# Patient Record
Sex: Female | Born: 2000 | Race: Black or African American | Hispanic: No | Marital: Single | Smoking: Never smoker
Health system: Southern US, Academic
[De-identification: ages and names within clinical notes are randomized; demographics above are authoritative.]

## PROBLEM LIST (undated history)

## (undated) DIAGNOSIS — K2100 Gastro-esophageal reflux disease with esophagitis, without bleeding: Secondary | ICD-10-CM

## (undated) DIAGNOSIS — J45909 Unspecified asthma, uncomplicated: Secondary | ICD-10-CM

## (undated) HISTORY — PX: CHOLECYSTECTOMY: SHX55

---
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
## 2018-04-01 ENCOUNTER — Emergency Department (HOSPITAL_COMMUNITY): Payer: Medicaid - Out of State

## 2018-04-01 ENCOUNTER — Other Ambulatory Visit: Payer: Self-pay

## 2018-04-01 ENCOUNTER — Encounter (HOSPITAL_COMMUNITY): Payer: Self-pay

## 2018-04-01 ENCOUNTER — Emergency Department (HOSPITAL_COMMUNITY)
Admission: EM | Admit: 2018-04-01 | Discharge: 2018-04-01 | Disposition: A | Discharge status: Home / Self Care | Payer: Medicaid - Out of State | Attending: Emergency Medicine | Admitting: Emergency Medicine

## 2018-04-01 DIAGNOSIS — J45909 Unspecified asthma, uncomplicated: Secondary | ICD-10-CM | POA: Diagnosis not present

## 2018-04-01 DIAGNOSIS — R000 Tachycardia, unspecified: Secondary | ICD-10-CM | POA: Diagnosis present

## 2018-04-01 LAB — I-STAT BETA HCG BLOOD, ED (MC, WL, AP ONLY)

## 2018-04-01 LAB — CBC
HCT: 37.1 % (ref 36.0–46.0)
Hemoglobin: 12.3 g/dL (ref 12.0–15.0)
MCH: 29.4 pg (ref 26.0–34.0)
MCHC: 33.2 g/dL (ref 30.0–36.0)
MCV: 88.8 fL (ref 80.0–100.0)
NRBC: 0.2 % (ref 0.0–0.2)
PLATELETS: 366 10*3/uL (ref 150–400)
RBC: 4.18 MIL/uL (ref 3.87–5.11)
RDW: 11.8 % (ref 11.5–15.5)
WBC: 11.5 10*3/uL — AB (ref 4.0–10.5)

## 2018-04-01 LAB — BASIC METABOLIC PANEL
Anion gap: 8 (ref 5–15)
BUN: 7 mg/dL (ref 6–20)
CALCIUM: 9.2 mg/dL (ref 8.9–10.3)
CO2: 24 mmol/L (ref 22–32)
Chloride: 105 mmol/L (ref 98–111)
Creatinine, Ser: 0.8 mg/dL (ref 0.44–1.00)
GFR calc non Af Amer: >60 mL/min (ref >60)
Glucose, Bld: 102 mg/dL — ABNORMAL HIGH (ref 70–99)
Potassium: 3.2 mmol/L — ABNORMAL LOW (ref 3.5–5.1)
SODIUM: 137 mmol/L (ref 135–145)

## 2018-04-01 LAB — TROPONIN I

## 2018-04-01 LAB — D-DIMER, QUANTITATIVE: D-Dimer, Quant: <0.27 ug/mL-FEU (ref 0.00–0.50)

## 2018-04-01 LAB — I-STAT TROPONIN, ED: Troponin i, poc: 0 ng/mL (ref 0.00–0.08)

## 2018-04-01 MED ORDER — SODIUM CHLORIDE 0.9 % IV BOLUS
1000.0000 mL | Freq: Once | INTRAVENOUS | Status: AC
  Administered 2018-04-01: 1000 mL via INTRAVENOUS

## 2018-04-01 NOTE — ED Provider Notes (Signed)
[facility] EMERGENCY DEPARTMENT Provider Note   CSN: [id number] Arrival date & time: 04/01/18  0113     History   Chief Complaint Chief Complaint  Patient presents with  . Tachycardia    HPI Michelle Byrd is a 17 y.o. female presenting today for palpitations and tachycardia after smoking marijuana with her friends at midnight.  Patient states that about 10 minutes after smoking she developed palpitations that she describes as a fluttering in the center of her chest.  Patient then went outside to get fresh air and the palpitations did not resolve so she presented to the emergency department.  The patient states that the palpitations resolved spontaneously shortly after arrival to the emergency department.  Patient states that she occasionally gets palpitations that she generally attributes to anxiety.  At time of evaluation tachycardia has resolved, patient resting comfortably in bed in no acute distress.  Patient states that she is felt well and has not had palpitations for the past few hours.  Patient denies family history of MI under the age of 74, history of DVT, family history of sudden cardiac death, personal history of cancer.  Patient states that she does take oral birth control pills.  HPI  Past Medical History:  Diagnosis Date  . Asthma   . Reflux esophagitis     There are no active problems to display for this patient.   History reviewed. No pertinent surgical history.   OB History   None      Home Medications    Prior to Admission medications   Medication Sig Start Date End Date Taking? Authorizing Provider  PROAIR HFA 108 [phone] Base) MCG/ACT inhaler Take 2 puffs by mouth every 4 (four) hours as needed for wheezing or shortness of breath.  05/20/17  Yes [provider]  omeprazole (PRILOSEC) 20 MG capsule Take 1 capsule (20 mg total) by mouth daily. Patient not taking: Reported on 08/19/2017 01/23/17   Muthersbaugh, Dahlia Client, PA-C    ondansetron (ZOFRAN ODT) 8 MG disintegrating tablet 8mg  ODT q4 hours prn nausea Patient not taking: Reported on 04/01/2018 01/23/17   Muthersbaugh, Dahlia Client, PA-C  ranitidine (ZANTAC) 150 MG tablet Take 1 tablet (150 mg total) by mouth 2 (two) times daily. Patient not taking: Reported on 04/01/2018 01/23/17   Muthersbaugh, Boyd Kerbs    Family History History reviewed. No pertinent family history.  Social History Social History   Tobacco Use  . Smoking status: Never Smoker  . Smokeless tobacco: Never Used  Substance Use Topics  . Alcohol use: No  . Drug use: No     Allergies   Morphine and related   Review of Systems Review of Systems  Constitutional: Negative.  Negative for chills and fever.  HENT: Negative.  Negative for rhinorrhea and sore throat.   Eyes: Negative.  Negative for visual disturbance.  Respiratory: Negative.  Negative for cough and shortness of breath.   Cardiovascular: Positive for palpitations. Negative for chest pain and leg swelling.  Gastrointestinal: Negative.  Negative for abdominal pain, blood in stool, diarrhea, nausea and vomiting.  Genitourinary: Negative.  Negative for dysuria and hematuria.  Musculoskeletal: Negative.  Negative for arthralgias and myalgias.  Skin: Negative.  Negative for rash.  Neurological: Negative.  Negative for dizziness, weakness and headaches.     Physical Exam Updated Vital Signs BP (!) 118/57   Pulse 86   Temp 98.1 F (36.7 C) (Oral)   Resp 18   Ht 5\' 3"  (1.6 m)  Wt 61.2 kg   SpO2 100%   BMI 23.91 kg/m   Physical Exam  Constitutional: She appears well-developed and well-nourished. No distress.  HENT:  Head: Normocephalic and atraumatic.  Right Ear: External ear normal.  Left Ear: External ear normal.  Nose: Nose normal.  Mouth/Throat: Uvula is midline and oropharynx is clear and moist.  Eyes: Pupils are equal, round, and reactive to light. EOM are normal.  Neck: Trachea normal, normal range of  motion, full passive range of motion without pain and phonation normal. Neck supple. No tracheal deviation present.  Cardiovascular: Normal rate, regular rhythm, normal heart sounds and intact distal pulses.  Pulses:      Radial pulses are 2+ on the right side, and 2+ on the left side.       Dorsalis pedis pulses are 2+ on the right side, and 2+ on the left side.       Posterior tibial pulses are 2+ on the right side, and 2+ on the left side.  Pulmonary/Chest: Effort normal and breath sounds normal. No respiratory distress. She exhibits no tenderness, no crepitus and no deformity.  Abdominal: Soft. There is no tenderness. There is no rigidity, no rebound and no guarding.  Musculoskeletal: Normal range of motion.       Right lower leg: Normal.       Left lower leg: Normal.  Feet:  Right Foot:  Protective Sensation: 3 sites tested. 3 sites sensed.  Left Foot:  Protective Sensation: 3 sites tested. 3 sites sensed.  Neurological: She is alert. No sensory deficit. GCS eye subscore is 4. GCS verbal subscore is 5. GCS motor subscore is 6.  Speech is clear and goal oriented, follows commands Major Cranial nerves without deficit, no facial droop Normal strength in upper and lower extremities bilaterally including dorsiflexion and plantar flexion, strong and equal grip strength Sensation normal to light touch Moves extremities without ataxia, coordination intact Normal gait  Skin: Skin is warm and dry. Capillary refill takes less than 2 seconds.  Psychiatric: She has a normal mood and affect. Her behavior is normal.   ED Treatments / Results  Labs (all labs ordered are listed, but only abnormal results are displayed) Labs Reviewed  BASIC METABOLIC PANEL - Abnormal; Notable for the following components:      Result Value   Potassium 3.2 (*)    Glucose, Bld 102 (*)    All other components within normal limits  CBC - Abnormal; Notable for the following components:   WBC 11.5 (*)    All other  components within normal limits  D-DIMER, QUANTITATIVE (NOT AT [facility])  TROPONIN I  I-STAT TROPONIN, ED  I-STAT BETA HCG BLOOD, ED ([facility], [facility], AP ONLY)    EKG EKG Interpretation  Date/Time:  Tuesday April 01 2018 07:47:01 EDT Ventricular Rate:  79 PR Interval:  176 QRS Duration: 67 QT Interval:  356 QTC Calculation: 408 R Axis:   86 Text Interpretation:  Sinus rhythm Nonspecific T abnrm, anterolateral leads ST elev, probable normal early repol pattern Confirmed by Tilden Fossa [phone]) on 04/01/2018 8:29:36 AM Also confirmed by Tilden Fossa [phone]), editor Jac Canavan, [place] ([id number])  on 04/01/2018 9:38:07 AM  Vent. rate 79 BPM PR interval * ms QRS duration 67 ms QT/QTc 356/408 ms P-R-T axes 104 86 69 Sinus rhythm Nonspecific T abnrm, anterolateral leads ST elev, probable normal early repol pattern Confirmed by Tilden Fossa [phone]) on 04/01/2018 8:29:36 AM Also confirmed by Tilden Fossa [phone]), editor Elita Quick ([id number]) on  04/01/2018 9:38:07 AM   Radiology Dg Chest 2 View  Result Date: 04/01/2018 CLINICAL DATA:  Tachycardia, chest tightness EXAM: CHEST - 2 VIEW COMPARISON:  08/19/2017 FINDINGS: Heart and mediastinal contours are within normal limits. No focal opacities or effusions. No acute bony abnormality. IMPRESSION: No active cardiopulmonary disease. Electronically Signed   By: Charlett Nose M.D.   On: 04/01/2018 01:58    Procedures Procedures (including critical care time)  Medications Ordered in ED Medications  sodium chloride 0.9 % bolus 1,000 mL (0 mLs Intravenous Stopped 04/01/18 0928)     Initial Impression / Assessment and Plan / ED Course  I have reviewed the triage vital signs and the nursing notes.  Pertinent labs & imaging results that were available during my care of the patient were reviewed by me and considered in my medical decision making (see chart for details).  Clinical Course as of Apr 01 1126  Tue Apr 01, 2018  4098  Discussed with w/ Dr. Madilyn Hook; pending normal Ddimer and Trop, d/c with cardiology follow-up and encourage cessation of marijuana.    [BM]  1014 Rediscussed with Dr Madilyn Hook; agrees with discharge w/ cardiology follow-up outpatient.   [BM]    Clinical Course User Index [BM] Bill Salinas, PA-C   17 year old female presenting with palpitations and tachycardia after smoking marijuana.  Troponin negative x2 D-dimer negative Beta hCG negative CBC nonacute BMP nonacute Chest x-ray negative Initial EKG with sinus tachycardia-reviewed by Dr. Elesa Massed Subsequent EKG normal sinus-reviewed by Dr. Madilyn Hook  Afebrile, not tachycardic, not hypotensive resting comfortably in no acute distress.  Patient denies recurrence of palpitations since she presented to the emergency department.  Patient has been resting comfortably for approximately 8 hours.  Case discussed with Dr. Madilyn Hook, Dr. Madilyn Hook advises cessation of marijuana and outpatient follow-up with cardiology.  Patient encouraged to stop smoking marijuana and to call cardiology office to schedule appointment today.  At this time there does not appear to be any evidence of an acute emergency medical condition and the patient appears stable for discharge with appropriate outpatient follow up. Diagnosis was discussed with patient who verbalizes understanding of care plan and is agreeable to discharge. I have discussed return precautions with patient whos verbalize understanding of return precautions. Patient strongly encouraged to follow-up with their PCP. All questions answered.   Note: Portions of this report may have been transcribed using voice recognition software. Every effort was made to ensure accuracy; however, inadvertent computerized transcription errors may still be present. Final Clinical Impressions(s) / ED Diagnoses   Final diagnoses:  Sinus tachycardia    ED Discharge Orders    None       Bill Salinas, PA-C 04/01/18 1131    Ward,  Layla Maw, DO 04/02/18 [phone]

## 2018-04-01 NOTE — ED Triage Notes (Signed)
Pt here after smoking mariajuana and her heart rate went up.  Denies any chest pain or shortness of breath. A&Ox4.

## 2018-04-01 NOTE — Discharge Instructions (Signed)
Please return to the Emergency Department for any new or worsening symptoms or if your symptoms do not improve. Please be sure to follow up with your Primary Care Physician as soon as possible regarding your visit today. If you do not have a Primary Doctor please use the resources below to establish one. Please call to schedule appointment with [facility] health cardiology group as soon as possible. Please avoid drug and alcohol use including marijuana.  Contact a health care provider if: You have a fever. You have vomiting or diarrhea that keeps happening (is persistent). Get help right away if: You have pain in your chest, upper arms, jaw, or neck. You become weak or dizzy. You feel faint. You have palpitations that do not go away.  Do not take your medicine if  develop an itchy rash, swelling in your mouth or lips, or difficulty breathing.   RESOURCE GUIDE  Chronic Pain Problems: Contact [facility]  [phone] Patients need to be referred by their primary care doctor.  Insufficient Money for Medicine: Contact United Way:  call "211" or [facility] 803-104-3856.  No Primary Care Doctor: Call Health Connect  [phone] - can help you locate a primary care doctor that  accepts your insurance, provides certain services, etc. Physician Referral Service- [phone]  Agencies that provide inexpensive medical care: [facility]  [phone] [facility]  [phone] [facility]  [phone] [facility]  [phone] [facility]  [phone] [facility] Clinic  [phone]  Medicaid-accepting [facility] Providers: [facility]- [address]  [phone], Mon-Fri 9am-7pm, Sat 9am-1pm [facility]- [address] [place], [address] [place]  [phone] [facility]- [address], [address] [place]  [phone] [facility]- [address]  [phone] Renaye Rakers- [address] [place], [address], [phone]  Only accepts [place] patients after they have their name  applied to their card  Self Pay (no insurance) in [facility]: Sickle Cell Patients: Dr Willey Blade, [facility]  [address] [place], [phone] [facility] Urgent Care- [address] [place]  [phone]       [facility] [place]- [address]       -     [facility]- see information above (Speak to [employer] if you do not have insurance)       -  [facility]- [address] [place], [phone]       -  [facility]- [address],  [phone]       -  [facility]- [address] [phone]       -  Dr Julio Sicks-  [address], [place], [phone]       -  [facility]- [address], [phone]       -  [facility]- [address] [phone], also [address] [phone]       -    [facility]- [address] [place], [phone], 1st & 3rd Saturday   every month, 10am-1pm  1) Find a Doctor and Pay Out of Pocket Although you won't have to find out who is covered by your insurance plan, it is a good idea to ask around and get recommendations. You will then  need to call the office and see if the doctor you have chosen will accept you as a new patient and what types of options they offer for patients who are self-pay. Some doctors offer discounts or will set up payment plans for their patients who do not have insurance, but you will need to ask so you aren't surprised when you get to your appointment.  2) Contact Your Local Health Department Not all health departments have doctors that can see patients for sick visits, but many do, so it is worth a call to see if yours does. If you don't know where your local health department is, you can check in your phone book. The CDC also has a tool to help you locate your state's health  department, and many state websites also have listings of all of their local health departments.  3) Find a Walk-in Clinic If your illness is not likely to be very severe or complicated, you may want to try a walk in clinic. These are popping up all over the country in pharmacies, drugstores, and shopping centers. They're usually staffed by nurse practitioners or physician assistants that have been trained to treat common illnesses and complaints. They're usually fairly quick and inexpensive. However, if you have serious medical issues or chronic medical problems, these are probably not your best option  STD Testing [facility] Department of [facility], STD Clinic, [address], [place], phone [phone] or [phone].  Monday - Friday, call for an appointment. [facility] Department of [employer], STD Clinic, [place] [address], [place], phone [phone] or [phone].  Monday - Friday, call for an appointment.  Abuse/Neglect: [facility] Child Abuse Hotline 715 663 3860 [facility] Child Abuse Hotline 519 140 7427 (After Hours)  Emergency Shelter:  [facility] 8602652321  Maternity Homes: Room at the [place] [facility] [phone] [facility] [phone]  MRSA Hotline #:   219-146-7889  [facility] Resources  [facility] [place]  [employer] [facility] Dept. [address]                 [address]         [address] [place] [address]  Blondell Reveal Phone:  [phone]                                  Phone:  [phone]                   Phone:  [phone]  [facility], [phone] [facility]- [phone]       -     [facility] in [place], [address],                                   [phone], Insurance  [facility] Child Abuse Hotline (640)245-2481 or [phone] (After Hours)   Behavioral Health Services  Substance Abuse Resources: [facility] Drug [facility]  ([phone] [facility] 949-604-6727 [facility] [phone] Floydene Flock 815-319-4541 Residential & Outpatient Substance Abuse Program  [phone]  Psychological Services: [facility]  [phone] [facility] Services  [phone] [facility], [phone] [place]. [address], [place], ACCESS LINE: [phone] or [phone], [employer]za  Dental Assistance  If unable to pay or uninsured, contact:  [facility] or [facility]. to become qualified for the adult dental clinic.  Patients with Medicaid: [facility] [phone] [address], [phone] [address], [phone]  If unable to pay, or uninsured, contact [facility] (806)828-6320) or [facility] Department [phone] in [place], [phone] in [facility]) to become qualified for the adult dental clinic   Other Low-Cost Community Dental Services: [facility]- [address] [place], [place], [place], [address], [phone], [address]. [address], 2nd and 4th Thursday of the month at 6:30am.  10 clients each day by appointment, can sometimes see walk-in patients if someone does not show for an appointment. [facility]- [address] [place], [place], [address], [phone] [facility] [address] [place], [place], [address], [phone] [facility] Health Department- [phone] [facility] Health Department- [phone] [facility] [phone]

## 2018-10-08 ENCOUNTER — Inpatient Hospital Stay (HOSPITAL_COMMUNITY)
Admission: EM | Admit: 2018-10-08 | Discharge: 2018-10-08 | Disposition: A | Discharge status: Home / Self Care | Payer: Medicare (Managed Care) | Source: Other Acute Inpatient Hospital

## 2018-10-08 DIAGNOSIS — R8281 Pyuria: Secondary | ICD-10-CM

## 2018-10-08 DIAGNOSIS — R112 Nausea with vomiting, unspecified: Secondary | ICD-10-CM

## 2018-10-08 DIAGNOSIS — R42 Dizziness and giddiness: Secondary | ICD-10-CM

## 2019-01-03 ENCOUNTER — Inpatient Hospital Stay (HOSPITAL_COMMUNITY)
Admission: EM | Admit: 2019-01-03 | Discharge: 2019-01-03 | Disposition: A | Discharge status: Home / Self Care | Payer: Medicare (Managed Care) | Source: Other Acute Inpatient Hospital

## 2019-01-03 DIAGNOSIS — R05 Cough: Secondary | ICD-10-CM

## 2019-01-03 DIAGNOSIS — K219 Gastro-esophageal reflux disease without esophagitis: Secondary | ICD-10-CM

## 2019-01-03 DIAGNOSIS — J40 Bronchitis, not specified as acute or chronic: Secondary | ICD-10-CM

## 2019-01-10 ENCOUNTER — Inpatient Hospital Stay (HOSPITAL_COMMUNITY)
Admission: EM | Admit: 2019-01-10 | Discharge: 2019-01-10 | Disposition: A | Discharge status: Home / Self Care | Payer: Medicare (Managed Care) | Source: Other Acute Inpatient Hospital

## 2019-01-10 DIAGNOSIS — N760 Acute vaginitis: Secondary | ICD-10-CM

## 2019-02-07 ENCOUNTER — Other Ambulatory Visit: Payer: Self-pay

## 2019-02-07 DIAGNOSIS — Z20822 Contact with and (suspected) exposure to covid-19: Secondary | ICD-10-CM

## 2019-02-09 LAB — NOVEL CORONAVIRUS, NAA: SARS-CoV-2, NAA: NOT DETECTED

## 2019-03-15 ENCOUNTER — Other Ambulatory Visit: Payer: Self-pay

## 2019-03-15 ENCOUNTER — Encounter (HOSPITAL_COMMUNITY): Payer: Self-pay | Admitting: *Deleted

## 2019-03-15 ENCOUNTER — Emergency Department (HOSPITAL_COMMUNITY): Payer: MEDICARE

## 2019-03-15 ENCOUNTER — Emergency Department (HOSPITAL_COMMUNITY)
Admission: EM | Admit: 2019-03-15 | Discharge: 2019-03-15 | Disposition: A | Discharge status: Home / Self Care | Payer: MEDICARE | Attending: Emergency Medicine | Admitting: Emergency Medicine

## 2019-03-15 DIAGNOSIS — R109 Unspecified abdominal pain: Secondary | ICD-10-CM

## 2019-03-15 DIAGNOSIS — R1010 Upper abdominal pain, unspecified: Secondary | ICD-10-CM | POA: Insufficient documentation

## 2019-03-15 DIAGNOSIS — R1110 Vomiting, unspecified: Secondary | ICD-10-CM | POA: Diagnosis not present

## 2019-03-15 DIAGNOSIS — Z79899 Other long term (current) drug therapy: Secondary | ICD-10-CM | POA: Insufficient documentation

## 2019-03-15 DIAGNOSIS — R197 Diarrhea, unspecified: Secondary | ICD-10-CM | POA: Diagnosis not present

## 2019-03-15 DIAGNOSIS — R17 Unspecified jaundice: Secondary | ICD-10-CM | POA: Diagnosis not present

## 2019-03-15 LAB — COMPREHENSIVE METABOLIC PANEL
ALT: 12 U/L (ref 0–44)
AST: 15 U/L (ref 15–41)
Albumin: 3.5 g/dL (ref 3.5–5.0)
Alkaline Phosphatase: 30 U/L — ABNORMAL LOW (ref 38–126)
Anion gap: 10 (ref 5–15)
BUN: 8 mg/dL (ref 6–20)
CO2: 23 mmol/L (ref 22–32)
Calcium: 9 mg/dL (ref 8.9–10.3)
Chloride: 104 mmol/L (ref 98–111)
Creatinine, Ser: 0.77 mg/dL (ref 0.44–1.00)
GFR calc Af Amer: >60 mL/min (ref >60)
GFR calc non Af Amer: >60 mL/min (ref >60)
Glucose, Bld: 112 mg/dL — ABNORMAL HIGH (ref 70–99)
Potassium: 3.7 mmol/L (ref 3.5–5.1)
Sodium: 137 mmol/L (ref 135–145)
Total Bilirubin: 2.6 mg/dL — ABNORMAL HIGH (ref 0.3–1.2)
Total Protein: 6.6 g/dL (ref 6.5–8.1)

## 2019-03-15 LAB — URINALYSIS, ROUTINE W REFLEX MICROSCOPIC
Bilirubin Urine: NEGATIVE
Glucose, UA: NEGATIVE mg/dL
Hgb urine dipstick: NEGATIVE
Ketones, ur: 5 mg/dL — AB
Leukocytes,Ua: NEGATIVE
Nitrite: NEGATIVE
Protein, ur: NEGATIVE mg/dL
Specific Gravity, Urine: 1.018 (ref 1.005–1.030)
pH: 8 (ref 5.0–8.0)

## 2019-03-15 LAB — I-STAT BETA HCG BLOOD, ED (MC, WL, AP ONLY): I-stat hCG, quantitative: <5 m[IU]/mL (ref <5)

## 2019-03-15 LAB — CBC
HCT: 38 % (ref 36.0–46.0)
Hemoglobin: 13.1 g/dL (ref 12.0–15.0)
MCH: 30.8 pg (ref 26.0–34.0)
MCHC: 34.5 g/dL (ref 30.0–36.0)
MCV: 89.2 fL (ref 80.0–100.0)
Platelets: 369 10*3/uL (ref 150–400)
RBC: 4.26 MIL/uL (ref 3.87–5.11)
RDW: 11.9 % (ref 11.5–15.5)
WBC: 7.6 10*3/uL (ref 4.0–10.5)
nRBC: 0 % (ref 0.0–0.2)

## 2019-03-15 LAB — LIPASE, BLOOD: Lipase: 23 U/L (ref 11–51)

## 2019-03-15 MED ORDER — ONDANSETRON 4 MG PO TBDP: ORAL_TABLET | ORAL | 0 refills | Status: DC

## 2019-03-15 MED ORDER — SODIUM CHLORIDE 0.9 % IV BOLUS
1000.0000 mL | Freq: Once | INTRAVENOUS | Status: AC
  Administered 2019-03-15: 1000 mL via INTRAVENOUS

## 2019-03-15 MED ORDER — SODIUM CHLORIDE 0.9% FLUSH: 3.0000 mL | Freq: Once | INTRAVENOUS | Status: DC

## 2019-03-15 MED ORDER — ONDANSETRON HCL 4 MG/2ML IJ SOLN
4.0000 mg | Freq: Once | INTRAMUSCULAR | Status: AC
  Administered 2019-03-15: 4 mg via INTRAVENOUS
  Filled 2019-03-15: qty 2

## 2019-03-15 NOTE — ED Triage Notes (Signed)
Vomiting, diarrhea and abdominal pain since yesterday, feels dehydrated. No fevers.

## 2019-03-15 NOTE — Discharge Instructions (Addendum)
Follow-up with gastroenterology later this week call for appointment. Take Zofran as needed for nausea and vomiting. Return for persistent vomiting, lethargy, fevers, uncontrolled pain or new concerns.

## 2019-03-15 NOTE — ED Provider Notes (Signed)
[facility] Provider Note   CSN: [id number] Arrival date & time: 03/15/19  1629     History   Chief Complaint Chief Complaint  Patient presents with  . Emesis    HPI Michelle Byrd is a 18 y.o. female.     Patient with history of esophagitis and asthma controlled presents with recurrent vomiting diarrhea and abdominal discomfort upper since yesterday.  Patient has been told she had some liver test abnormalities in the past she is currently here for school and is from [place].  Patient denies fevers or sick contacts.  Symptoms fairly constant for 2 days.  No new medications, no leftover food, restaurant food or anything else new. No illegal drugs or etoh use     Past Medical History:  Diagnosis Date  . Asthma   . Reflux esophagitis     There are no active problems to display for this patient.   History reviewed. No pertinent surgical history.   OB History   No obstetric history on file.      Home Medications    Prior to Admission medications   Medication Sig Start Date End Date Taking? Authorizing Provider  omeprazole (PRILOSEC) 20 MG capsule Take 1 capsule (20 mg total) by mouth daily. Patient not taking: Reported on 08/19/2017 01/23/17   Muthersbaugh, Dahlia ClientHannah, PA-C  ondansetron (ZOFRAN ODT) 4 MG disintegrating tablet 4mg  ODT q4 hours prn nausea/vomit 03/15/19   Blane OharaZavitz, Neetu Carrozza, MD  ondansetron (ZOFRAN ODT) 8 MG disintegrating tablet 8mg  ODT q4 hours prn nausea Patient not taking: Reported on 04/01/2018 01/23/17   Muthersbaugh, Dahlia ClientHannah, PA-C  PROAIR HFA 108 (90 Base) MCG/ACT inhaler Take 2 puffs by mouth every 4 (four) hours as needed for wheezing or shortness of breath.  05/20/17   [provider]  ranitidine (ZANTAC) 150 MG tablet Take 1 tablet (150 mg total) by mouth 2 (two) times daily. Patient not taking: Reported on 04/01/2018 01/23/17   Muthersbaugh, Dahlia ClientHannah, PA-C    Family History No family history on file.   Social History Social History   Tobacco Use  . Smoking status: Never Smoker  . Smokeless tobacco: Never Used  Substance Use Topics  . Alcohol use: No  . Drug use: No     Allergies   Morphine and related   Review of Systems Review of Systems  Constitutional: Negative for chills and fever.  HENT: Negative for congestion.   Eyes: Negative for visual disturbance.  Respiratory: Negative for shortness of breath.   Cardiovascular: Negative for chest pain.  Gastrointestinal: Positive for abdominal pain, diarrhea, nausea and vomiting.  Genitourinary: Negative for dysuria and flank pain.  Musculoskeletal: Negative for back pain, neck pain and neck stiffness.  Skin: Negative for rash.  Neurological: Negative for light-headedness and headaches.     Physical Exam Updated Vital Signs BP 127/76 (BP Location: Left Arm)   Pulse 82   Temp 99.1 F (37.3 C) (Oral)   Resp 16   LMP 02/21/2019   SpO2 100%   Physical Exam Vitals signs and nursing note reviewed.  Constitutional:      Appearance: She is well-developed.  HENT:     Head: Normocephalic and atraumatic.  Eyes:     General: Scleral icterus (minimal) present.        Right eye: No discharge.        Left eye: No discharge.     Conjunctiva/sclera: Conjunctivae normal.  Neck:     Musculoskeletal: Normal range of motion and neck  supple.     Trachea: No tracheal deviation.  Cardiovascular:     Rate and Rhythm: Normal rate and regular rhythm.  Pulmonary:     Effort: Pulmonary effort is normal.     Breath sounds: Normal breath sounds.  Abdominal:     General: There is no distension.     Palpations: Abdomen is soft.     Tenderness: There is abdominal tenderness (mild upper). There is no guarding.  Skin:    General: Skin is warm.     Findings: No rash.  Neurological:     Mental Status: She is alert and oriented to person, place, and time.      ED Treatments / Results  Labs (all labs ordered are listed, but only  abnormal results are displayed) Labs Reviewed  COMPREHENSIVE METABOLIC PANEL - Abnormal; Notable for the following components:      Result Value   Glucose, Bld 112 (*)    Alkaline Phosphatase 30 (*)    Total Bilirubin 2.6 (*)    All other components within normal limits  URINALYSIS, ROUTINE W REFLEX MICROSCOPIC - Abnormal; Notable for the following components:   Ketones, ur 5 (*)    All other components within normal limits  GI PATHOGEN PANEL BY PCR, STOOL  LIPASE, BLOOD  CBC  HEPATITIS PANEL, ACUTE  I-STAT BETA HCG BLOOD, ED ([facility], [facility], [facility] ONLY)    EKG None  Radiology US Abdomen Limited Ruq  Result Date: 03/15/2019 CLINICAL DATA:  Abdominal pain, vomiting EXAM: ULTRASOUND ABDOMEN LIMITED RIGHT UPPER QUADRANT COMPARISON:  January 23, 2017 FINDINGS: Gallbladder: No gallstones or wall thickening visualized. No sonographic Murphy sign noted by sonographer. Common bile duct: Diameter: 4 mm Liver: No focal lesion identified. Within normal limits in parenchymal echogenicity. Portal vein is patent on color Doppler imaging with normal direction of blood flow towards the liver. Other: None. IMPRESSION: Normal liver and gallbladder Electronically Signed   By: Prudencio Pair M.D.   On: 03/15/2019 19:07    Procedures Procedures (including critical care time)  Medications Ordered in ED Medications  sodium chloride 0.9 % bolus 1,000 mL (0 mLs Intravenous Stopped 03/15/19 2100)  ondansetron (ZOFRAN) injection 4 mg (4 mg Intravenous Given 03/15/19 1936)     Initial Impression / Assessment and Plan / ED Course  I have reviewed the triage vital signs and the nursing notes.  Pertinent labs & imaging results that were available during my care of the patient were reviewed by me and considered in my medical decision making (see chart for details).       Overall well-appearing patient presents with recurrent vomiting diarrhea and upper abdominal discomfort.  Not specifically after eating fatty foods.   Patient's blood work reviewed normal white blood cell count, normal hemoglobin, elevated bilirubin 2.6.  With upper abdominal discomfort and elevated bilirubin ultrasound ordered for further delineation.  Discussed patient will need close follow-up with gastroenterology depending on the results.  No fever, no leukocytosis.  Acute hepatitis panel and stool culture ordered to help with outpatient follow-up.   Ultrasound of gallbladder results reviewed no acute abnormalities. Oral fluid challenge.  Patient stable for outpatient follow-up with gastroenterology and her primary doctor.  Final Clinical Impressions(s) / ED Diagnoses   Final diagnoses:  Vomiting and diarrhea  Serum total bilirubin elevated  Upper abdominal pain    ED Discharge Orders         Ordered    ondansetron (ZOFRAN ODT) 4 MG disintegrating tablet     03/15/19  2010           Blane Ohara, MD 03/17/19 [phone]

## 2019-03-15 NOTE — ED Notes (Signed)
Patient Alert and oriented to baseline. Stable and ambulatory to baseline. Patient verbalized understanding of the discharge instructions.  Patient belongings were taken by the patient.   

## 2019-03-16 LAB — HEPATITIS PANEL, ACUTE
HCV Ab: NONREACTIVE
Hep A IgM: NONREACTIVE
Hep B C IgM: NONREACTIVE
Hepatitis B Surface Ag: NONREACTIVE

## 2020-02-05 ENCOUNTER — Ambulatory Visit: Payer: MEDICARE | Attending: Family

## 2020-02-05 DIAGNOSIS — Z23 Encounter for immunization: Secondary | ICD-10-CM

## 2020-02-15 NOTE — Progress Notes (Signed)
° °  Covid-19 Vaccination Clinic  Name:  Yvonne Stopher    MRN: [id number] DOB: 2000-08-05  02/15/2020  Ms. Lish was observed post Covid-19 immunization for 15 minutes without incident. She was provided with Vaccine Information Sheet and instruction to access the V-Safe system.   Ms. Nolte was instructed to call 911 with any severe reactions post vaccine:  Difficulty breathing   Swelling of face and throat   A fast heartbeat   A bad rash all over body   Dizziness and weakness   Immunizations Administered    Name Date Dose VIS Date Route   Pfizer COVID-19 Vaccine 02/05/2020 11:00 AM 0.3 mL 08/05/2018 Intramuscular   Manufacturer: [employer], [employer]   Lot: [id number]   NDC: [address]-5

## 2020-02-16 ENCOUNTER — Encounter (HOSPITAL_COMMUNITY): Payer: Self-pay | Admitting: Emergency Medicine

## 2020-02-16 ENCOUNTER — Other Ambulatory Visit: Payer: Self-pay

## 2020-02-16 ENCOUNTER — Emergency Department (HOSPITAL_COMMUNITY)
Admission: EM | Admit: 2020-02-16 | Discharge: 2020-02-16 | Disposition: A | Discharge status: Home / Self Care | Payer: Medicaid - Out of State | Attending: Emergency Medicine | Admitting: Emergency Medicine

## 2020-02-16 DIAGNOSIS — R109 Unspecified abdominal pain: Secondary | ICD-10-CM | POA: Insufficient documentation

## 2020-02-16 DIAGNOSIS — Z5321 Procedure and treatment not carried out due to patient leaving prior to being seen by health care provider: Secondary | ICD-10-CM | POA: Insufficient documentation

## 2020-02-16 DIAGNOSIS — R112 Nausea with vomiting, unspecified: Secondary | ICD-10-CM | POA: Diagnosis not present

## 2020-02-16 LAB — URINALYSIS, ROUTINE W REFLEX MICROSCOPIC
Bilirubin Urine: NEGATIVE
Glucose, UA: NEGATIVE mg/dL
Hgb urine dipstick: NEGATIVE
Ketones, ur: NEGATIVE mg/dL
Leukocytes,Ua: NEGATIVE
Nitrite: NEGATIVE
Protein, ur: 30 mg/dL — AB
Specific Gravity, Urine: 1.024 (ref 1.005–1.030)
pH: 7 (ref 5.0–8.0)

## 2020-02-16 LAB — CBC
HCT: 37.4 % (ref 36.0–46.0)
Hemoglobin: 12 g/dL (ref 12.0–15.0)
MCH: 28.7 pg (ref 26.0–34.0)
MCHC: 32.1 g/dL (ref 30.0–36.0)
MCV: 89.5 fL (ref 80.0–100.0)
Platelets: 412 10*3/uL — ABNORMAL HIGH (ref 150–400)
RBC: 4.18 MIL/uL (ref 3.87–5.11)
RDW: 12.5 % (ref 11.5–15.5)
WBC: 7.9 10*3/uL (ref 4.0–10.5)
nRBC: 0 % (ref 0.0–0.2)

## 2020-02-16 LAB — COMPREHENSIVE METABOLIC PANEL
ALT: 15 U/L (ref 0–44)
AST: 16 U/L (ref 15–41)
Albumin: 3.7 g/dL (ref 3.5–5.0)
Alkaline Phosphatase: 35 U/L — ABNORMAL LOW (ref 38–126)
Anion gap: 9 (ref 5–15)
BUN: 15 mg/dL (ref 6–20)
CO2: 28 mmol/L (ref 22–32)
Calcium: 9.2 mg/dL (ref 8.9–10.3)
Chloride: 103 mmol/L (ref 98–111)
Creatinine, Ser: 0.81 mg/dL (ref 0.44–1.00)
GFR calc Af Amer: >60 mL/min (ref >60)
GFR calc non Af Amer: >60 mL/min (ref >60)
Glucose, Bld: 90 mg/dL (ref 70–99)
Potassium: 3.9 mmol/L (ref 3.5–5.1)
Sodium: 140 mmol/L (ref 135–145)
Total Bilirubin: 1.6 mg/dL — ABNORMAL HIGH (ref 0.3–1.2)
Total Protein: 6.9 g/dL (ref 6.5–8.1)

## 2020-02-16 LAB — I-STAT BETA HCG BLOOD, ED (MC, WL, AP ONLY): I-stat hCG, quantitative: <5 m[IU]/mL (ref <5)

## 2020-02-16 LAB — LIPASE, BLOOD: Lipase: 23 U/L (ref 11–51)

## 2020-02-16 NOTE — ED Triage Notes (Signed)
Pt endorses abd pain, N/V. Reports having gallbladder removed earlier this year.

## 2020-02-16 NOTE — ED Notes (Signed)
Pt called multiple times no answer 

## 2020-05-04 IMAGING — US US ABDOMEN LIMITED
1 series · 14 of 25 positions shown · non-contrast
Comparison: January 23, 2017

CLINICAL DATA: Abdominal pain, vomiting

EXAM:
ULTRASOUND ABDOMEN LIMITED RIGHT UPPER QUADRANT

[Series 1: us abdomen limited · 14 of 64 slices shown]
[im 1/64]
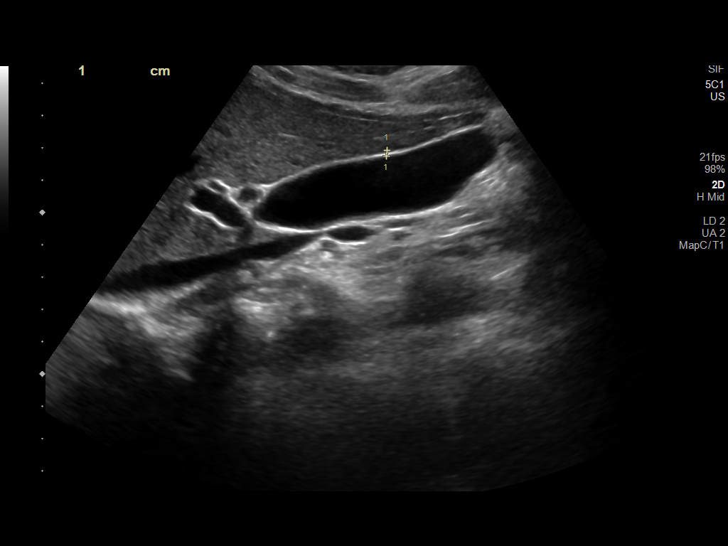
[im 6/64]
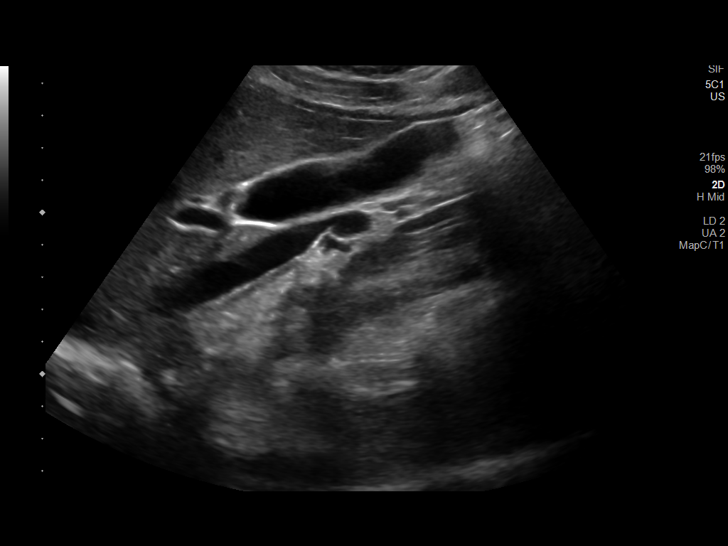
[im 11/64]
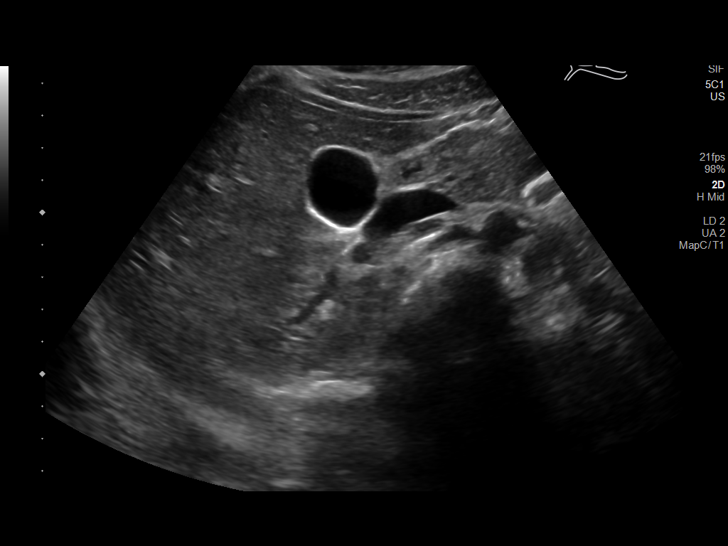
[im 16/64]
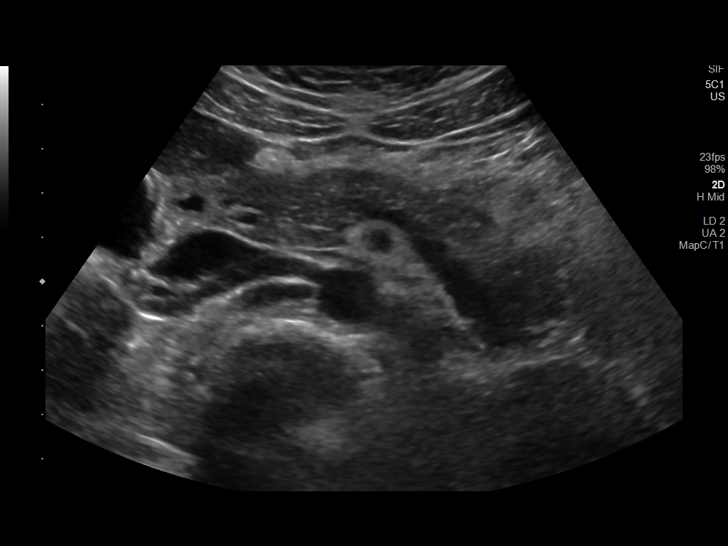
[im 22/64]
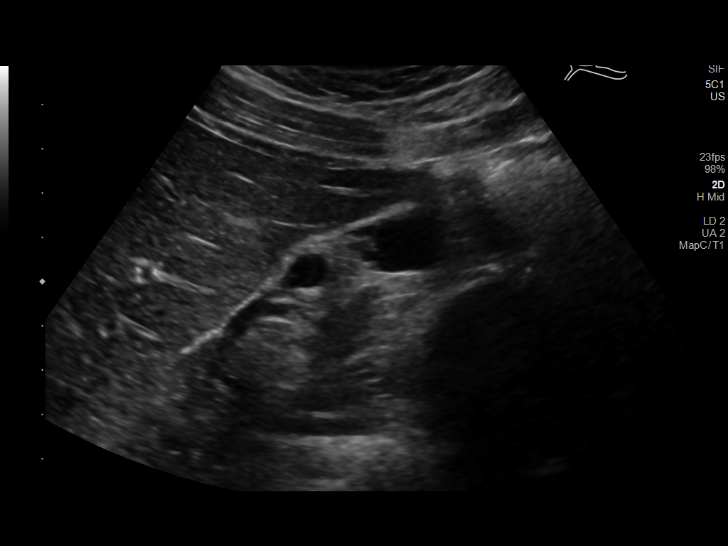
[im 24/64]
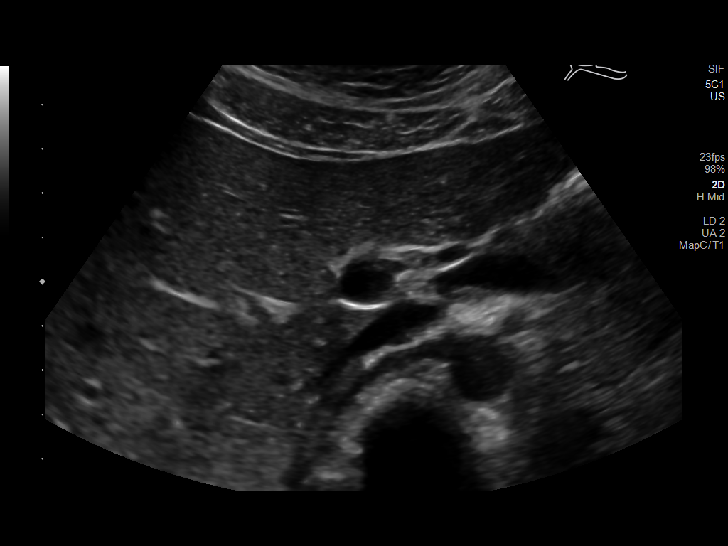
[im 29/64]
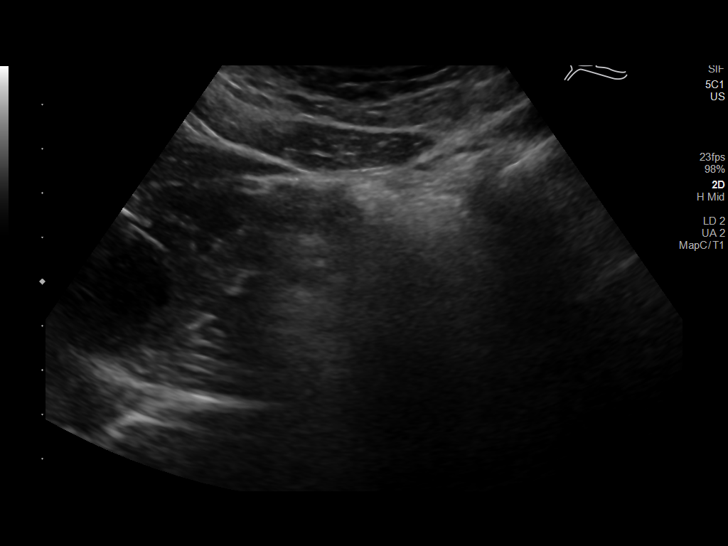
[im 35/64]
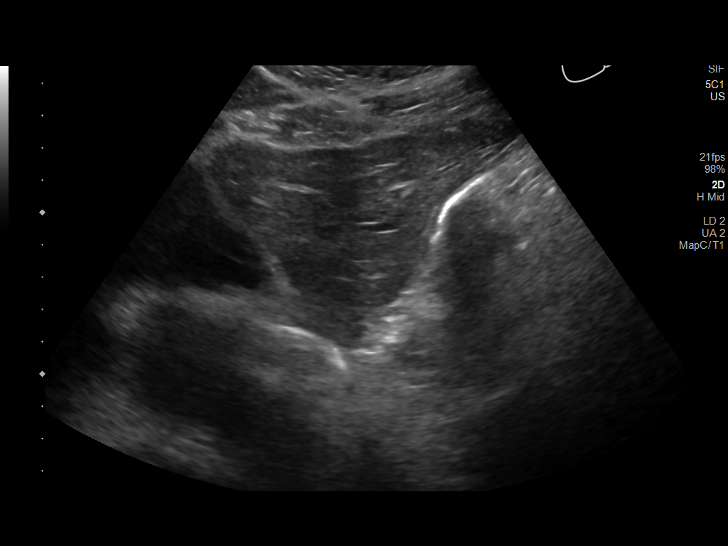
[im 40/64]
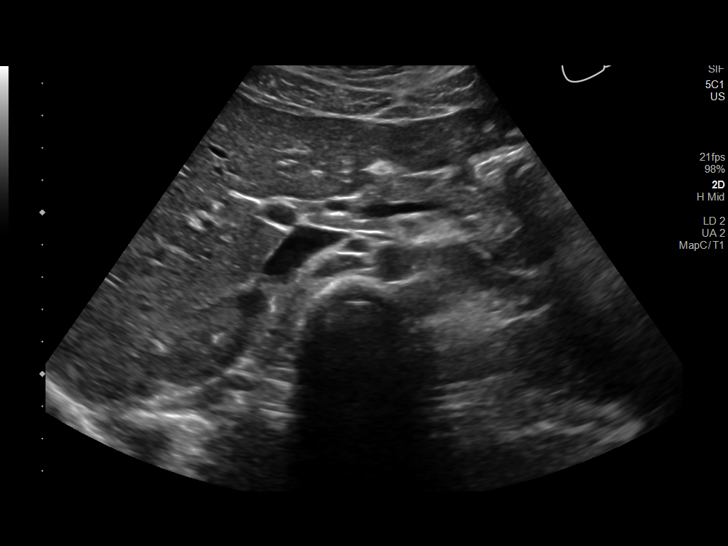
[im 43/64]
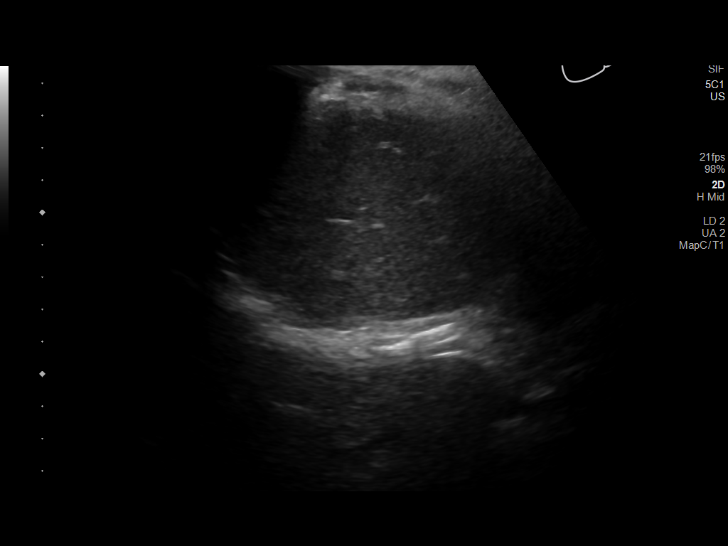
[im 48/64]
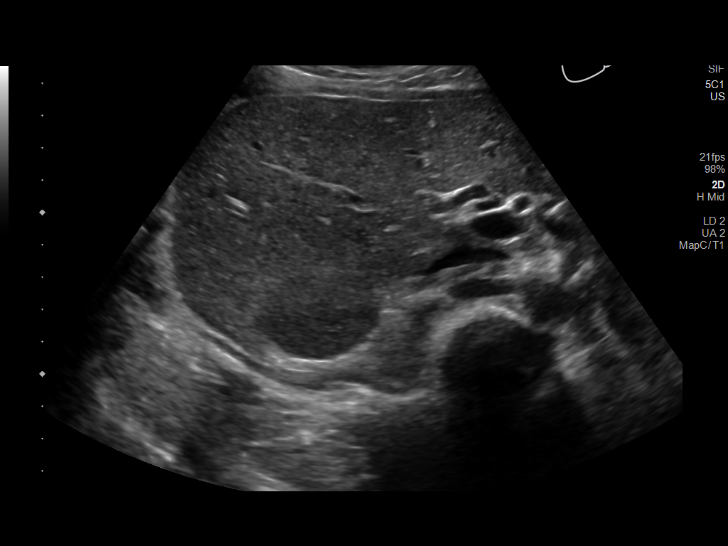
[im 53/64]
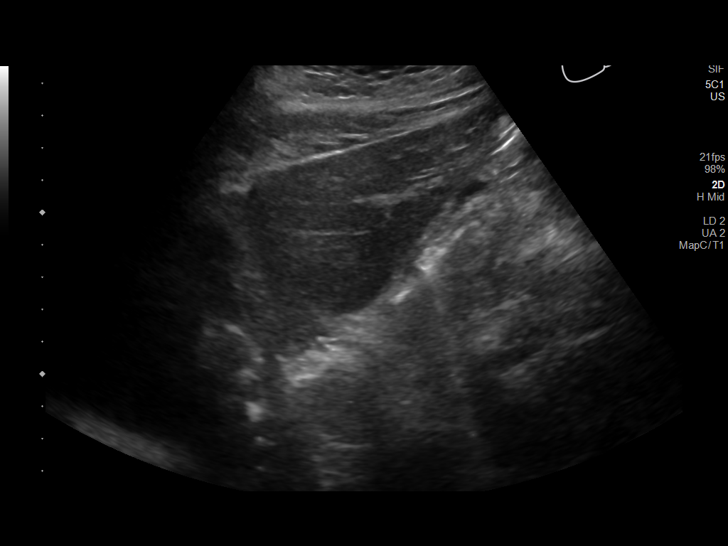
[im 58/64]
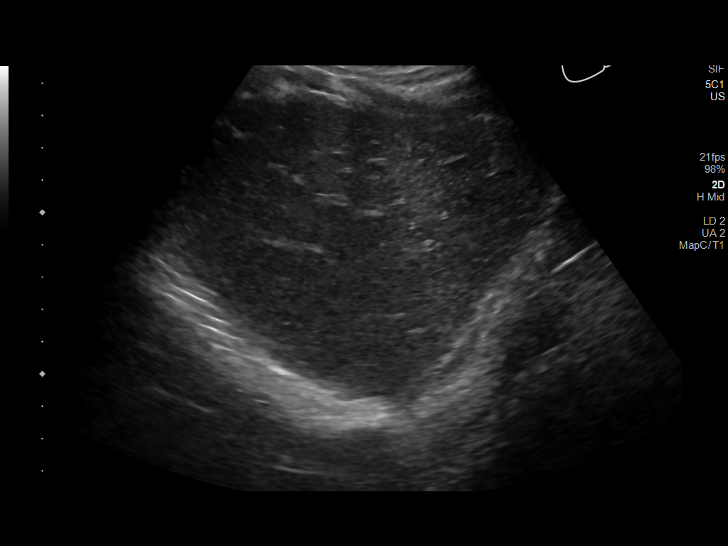
[im 64/64]
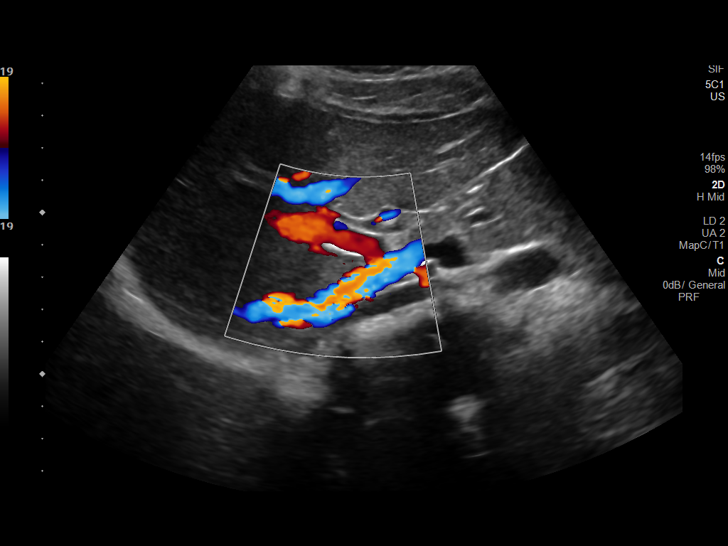

[14 of 25 positions shown; findings below may reference images not displayed]

FINDINGS: Gallbladder:

No gallstones or wall thickening visualized. No sonographic Murphy
sign noted by sonographer.

Common bile duct:

Diameter: 4 mm

Liver:

No focal lesion identified. Within normal limits in parenchymal
echogenicity. Portal vein is patent on color Doppler imaging with
normal direction of blood flow towards the liver.

Other: None.
IMPRESSION: Normal liver and gallbladder

## 2020-10-28 ENCOUNTER — Ambulatory Visit (INDEPENDENT_AMBULATORY_CARE_PROVIDER_SITE_OTHER): Payer: Medicaid Other

## 2020-10-28 ENCOUNTER — Encounter (INDEPENDENT_AMBULATORY_CARE_PROVIDER_SITE_OTHER): Payer: Self-pay

## 2020-10-28 ENCOUNTER — Other Ambulatory Visit: Payer: Self-pay

## 2020-10-28 VITALS — BP 130/81 | HR 83 | Temp 99.4°F | Resp 18 | Ht 64.0 in | Wt 130.0 lb

## 2020-10-28 DIAGNOSIS — Z6822 Body mass index (BMI) 22.0-22.9, adult: Secondary | ICD-10-CM

## 2020-10-28 DIAGNOSIS — J029 Acute pharyngitis, unspecified: Secondary | ICD-10-CM

## 2020-10-28 MED ORDER — PREDNISONE 50 MG TABLET
50.0000 mg | ORAL_TABLET | Freq: Every day | ORAL | 0 refills | Status: AC
Start: 2020-10-28 — End: 2020-11-02

## 2020-10-28 MED ORDER — PENICILLIN V POTASSIUM 500 MG TABLET
500.00 mg | ORAL_TABLET | Freq: Two times a day (BID) | ORAL | 0 refills | Status: DC
Start: 2020-10-28 — End: 2021-01-15

## 2020-10-28 NOTE — Patient Instructions (Signed)
Self-Care for Sore Throats     Sore throats happen for many reasons, such as colds, allergies, cigarette smoke, air pollution, and infections caused by viruses or bacteria. In any case, your throat becomes red and sore. Your goal for self-care is to reduce your discomfort while giving your throat a chance to heal.  Moisten and soothe your throat  Tips include the following:  · Try a sip of water first thing after waking up.  · Keep your throat moist by drinking 6 or more glasses of clear liquids every day.  · Run a cool-air humidifier in your room overnight.  · Stay away from cigarette smoke.   · Check the air quality index,if air pollution gives you a sore throat. On high pollution days, try to limit outdoor time.  · Suck on throat lozenges, cough drops, hard candy, ice chips, or frozen fruit-juice bars. Use the sugar-free versions if your diet or medical condition requires them.  Gargle to ease irritation  Gargling every hour or 2 can ease irritation. Try gargling with 1 of these solutions:  · 1/4 teaspoon of salt in 1/2 cup of warm water  · An over-the-counter anesthetic gargle  Use medicine for more relief  Over-the-counter medicine can reduce sore throat symptoms. Ask your pharmacist if you have questions about which medicine to use. To prevent possible medicine interactions, let the pharmacist know what medicines you take. To decrease symptoms:  · Ease pain with anesthetic sprays. Aspirin or an aspirin substitute also helps. Remember, never give aspirin to anyone 18 or younger. Don't take aspirin if you are already taking blood thinners.   · For sore throats caused by allergies, try antihistamines to block the allergic reaction.  Unless a sore throat is caused by a bacterial infection, antibiotics won’t help you.  Prevent future sore throats  Prevention tips include:  · Stop smoking or reduce contact with secondhand smoke. Smoke irritates the tender throat lining.  · Limit contact with pets and with  allergy-causing substances, such as pollen and mold.  · Wash your hands often when you’re around someone with a sore throat or cold. This will keep viruses or bacteria from spreading.  · Limit outdoor time when air pollution is bad.  · Don’t strain your vocal cords.  When to call your healthcare provider  Contact your healthcare provider if you have:  · Fever of 100.4°F (38.0°C) or higher, or as directed by your healthcare provider  · White spots on the throat  · Great Trouble swallowing  · A skin rash  · Recent exposure to someone else with strep bacteria  · Severe hoarseness and swollen glands in the neck or jaw  Call 911  Call 911 if any of the following occur:  · Trouble breathing or catching your breath  · Drooling and problems swallowing  · Wheezing  · Unable to talk  · Feeling dizzy or faint  · Feeling of doom  StayWell last reviewed this educational content on 02/09/2018  © 2000-2021 [employer]. All rights reserved. This information is not intended as a substitute for professional medical care. Always follow your healthcare professional's instructions.

## 2020-10-28 NOTE — Progress Notes (Signed)
[facility] [place]    ED PROVIDER: Con Memos, FNP-BC               Reason for Visit: Sore Throat (20 year old female presenting with a sore throat 1 day. Pt states she feels likke she is swallowing razorblades and feels achy. )      HPI:   Crystal Ruiz is a 20 y.o. female presenting to department for chief complaint of sore throat that began yesterday.  Said pain significantly worsened overnight.  Said she feels like her tonsils are swollen.  Admits to mild body aches.  Denies any other associated symptoms such as runny nose, fever, chills, cough.  Patient reports history of strep throat and said this feels the same.      ROS:    Constitutional: Denies fever or chills.  HENT: See HPI   Eyes: Denies any visual disturbances.  Respiratory: Denies cough, congestion or shortness or breath.  Cardiovascular: Denies chest pain.  Neurological: Denies headaches or dizziness.  Gastrointestinal: Denies n/v/d or abdominal pain.    Genitourinary: Denies any current urinary complaints.   Skin: Denies any rashes or abnormal lesions.      PHYSICAL EXAM:  Vitals:    10/28/20 0824   BP: 130/81   Pulse: 83   Resp: 18   Temp: 37.4 C (99.4 F)   SpO2: 100%   Weight: 59 kg (130 lb)   Height: 1.626 m (5\' 4" )   BMI: 22.36        General: Awake, alert, oriented. No acute distress. Well developed and nourished.  Head: Normocephalic. Atraumatic.  ENT: External ears, ear canals and TM's normal. Mucous membranes are moist.  Tonsils are erythematous.  1+ bilaterally.  There are bilateral tonsil stones and possible scant exudate.  Nose midline.  Neck: Supple  Eyes: EOMI. Pupils are equal, round, and reactive to light.   Cardiovascular: Normal rate and regular rhythm. Normal heart sounds. No murmur heard.  Pulmonary: Respirations easy on room air. Symmetric chest rise and fall. Breath sounds clear.  Abdominal: Bowel sounds are normal.   Neurological: No focal neurological deficits.  Musculoskeletal: No swelling or  obvious signs of injury.   Skin: Skin is warm and dry.         ASSESSMENT     2 ICD-10-CM    1. Pharyngitis, unspecified etiology  J02.9          MEDICAL DECISION MAKING AND COURSE    Diagnosis based on clinical exam.  No testing warranted at this time.  Patient was discharged with a prescription for penicillin VK to take twice a day for 10 days and prednisone 50 mg to take once a day for 5 days.  Advised cool fluids and over-the-counter medications for pain.      FOLLOW UP:  Patient advised to follow-up with PCP, specialist, return to urgent care or go to the emergency department with any new or worsening symptoms.    PCP:  Referring Not In System  No address on file    DISPOSITION:  Discharged    Below is pertinent information reviewed with patient and/or EMR:    PHQ Questionnaire  Little interest or pleasure in doing things.: Not at all  Feeling down, depressed, or hopeless: Not at all  PHQ 2 Total: 0        Current Outpatient Medications   Medication Sig   . penicillin V potassium (VEETID) 500 mg Oral Tablet Take 1 Tablet (500 mg  total) by mouth Twice daily   . predniSONE (DELTASONE) 50 mg Oral Tablet Take 1 Tablet (50 mg total) by mouth Once a day for 5 days     Allergies   Allergen Reactions   . Morphine Rash   . No Known Drug Allergies        Hx cholecystectomy.      Family Medical History:    None               Pharmacologist FNP-BC

## 2021-01-15 ENCOUNTER — Other Ambulatory Visit: Payer: Self-pay

## 2021-01-15 ENCOUNTER — Other Ambulatory Visit (HOSPITAL_COMMUNITY): Payer: Medicaid Other | Admitting: Family Medicine

## 2021-01-15 ENCOUNTER — Other Ambulatory Visit: Payer: Medicaid Other | Attending: Emergency Medicine | Admitting: Emergency Medicine

## 2021-01-15 ENCOUNTER — Ambulatory Visit (INDEPENDENT_AMBULATORY_CARE_PROVIDER_SITE_OTHER): Payer: Medicaid Other

## 2021-01-15 ENCOUNTER — Encounter (INDEPENDENT_AMBULATORY_CARE_PROVIDER_SITE_OTHER): Payer: Self-pay

## 2021-01-15 ENCOUNTER — Encounter (HOSPITAL_COMMUNITY): Payer: Self-pay | Admitting: Infection Control

## 2021-01-15 VITALS — BP 122/75 | HR 89 | Temp 98.1°F | Resp 18 | Ht 64.0 in | Wt 135.0 lb

## 2021-01-15 DIAGNOSIS — U071 COVID-19: Secondary | ICD-10-CM

## 2021-01-15 DIAGNOSIS — J029 Acute pharyngitis, unspecified: Secondary | ICD-10-CM | POA: Insufficient documentation

## 2021-01-15 DIAGNOSIS — Z6823 Body mass index (BMI) 23.0-23.9, adult: Secondary | ICD-10-CM

## 2021-01-15 DIAGNOSIS — J020 Streptococcal pharyngitis: Secondary | ICD-10-CM

## 2021-01-15 HISTORY — DX: COVID-19: U07.1

## 2021-01-15 LAB — RESPIRATORY PATHOGEN FLEX PANEL
ADENOVIRUS ARRAY: NOT DETECTED
BORDETELLA HOLMESII: NOT DETECTED
BORDETELLA PARAPERTUSSIS (IS 1001): NOT DETECTED
BORDETELLA PERTUSSIS ARRAY: NOT DETECTED
INFLUENZA A H1: NOT DETECTED
INFLUENZA A H3: NOT DETECTED
INFLUENZA A: NOT DETECTED
INFLUENZA B ARRAY: NOT DETECTED
METAPNEUMOVIRUS ARRAY: NOT DETECTED
PARAINFLUENZA 1 ARRAY: NOT DETECTED
PARAINFLUENZA 2 ARRAY: NOT DETECTED
PARAINFLUENZA 3 ARRAY: NOT DETECTED
PARAINFLUENZA 4 ARRAY: NOT DETECTED
RHINOVIRUS/ENTEROVIRUS ARRAY: NOT DETECTED
RSV A: NOT DETECTED
RSV B: NOT DETECTED

## 2021-01-15 LAB — COVID-19 ~~LOC~~ MOLECULAR LAB TESTING: SARS-CoV-2: DETECTED — AB

## 2021-01-15 MED ORDER — CEFDINIR 300 MG CAPSULE
300.0000 mg | ORAL_CAPSULE | Freq: Two times a day (BID) | ORAL | 0 refills | Status: AC
Start: 2021-01-15 — End: 2021-01-25

## 2021-01-15 NOTE — Addendum Note (Signed)
Addended by: Collene Schlichter on: 01/15/2021 07:51 PM     Modules accepted: Orders

## 2021-01-15 NOTE — Patient Instructions (Addendum)
Drink plenty of fluids.  Rest.  Tylenol and/or ibuprofen for pain/fever.  Cepacol lozenges as needed for sore throat.  Warm salt water gargles as needed.  Omnicef for 10 days.  You are considered contagious until 24 hours after starting the antibiotic.  Self quarantine until COVID test is negative.  After you have been on the antibiotic for 3 days throw away oral toothbrush and start using a new 1 as you can reinfected herself with toothbrush otherwise.  Contact your family doctor tomorrow to update them and arrange follow-up.        Pharyngitis: Strep (Confirmed)    You have had a positive test for strep throat. Strep throat is a contagious illness. It's spread by coughing, kissing, sharing glasses or eating utensils, or by touching others after touching your mouth or nose. Symptoms include throat pain that is worse with swallowing, aching all over, headache, swollen lymph nodes at the front of the neck, and red swollen tonsils sometimes with white patches and fever. It's treated with antibiotic medicine. This should help you start to feel better in 1 to 2 days.   Home care   Rest at home. Drink plenty of fluids so you won't getdehydrated.   No work or school for the first 2 days of taking the antibiotics. You can then return to school or work if you are feeling better, have been taking the antibiotic for at least 24 hours and don't have a fever.   Takeantibiotic medicine for the full 10 days, even if you feel better. This is very important to ensure the infection is treated completely.It's also important to prevent medicine-resistant germs from developing.If you were given an antibiotic shot, you don't need any more antibiotics.   You may use acetaminophenor ibuprofen to control pain or fever, unless another medicine was prescribed for this. Talk with your healthcare provider before taking these medicines if you havechronic liver or kidney disease or if you havehad a stomach ulcer or gastrointestinal  bleeding.   Throat lozenges or sprays help reduce pain. Gargling with warm saltwater will also reduce throat pain. Dissolve 1/2 teaspoon of salt in 1 glass of warm water. This may be useful just before meals.   Soft foods and cool or warm fluids are best. Don't eat salty or spicy foods.    Follow-up care  Follow up with your healthcare provider or our staff if you don't get better over the next week.   When to get medical advice  Call your healthcare provider right away or get immediate medical care if any of these occur:    Fever of 100.73F (38C) or higher, or as directed by your healthcare provider   New or worsening ear pain, sinus pain, or headache   Painful lumps in the back of neck   Stiff neck   Lymph nodesgetting larger or becoming soft in the middle   You have trouble swallowing liquids or you can'topen your mouth wide because ofthroat pain   Signs of dehydration. These include very dark urine or no urine, sunken eyes, and dizziness.   Noisy breathing   Muffled voice   Rash  Call 911  Call 911right away if you:    Have trouble breathing   Can't swallow or talk    Prevention  Here are steps you can take to help prevent an infection:    Wash your hands often with soap and clean, running water for at least 20 seconds.   Don't have close contact with  people who have sore throats, colds, or other upper respiratory infections.   Don't smoke, and stay away from secondhand smoke.  StayWell last reviewed this educational content on 08/10/2018   2000-2021 [employer], [place]. All rights reserved. This information is not intended as a substitute for professional medical care. Always follow your healthcare professional's instructions.        Self-Care for Sore Throats  Sore throats happen for many reasons, such as colds, allergies, cigarette smoke, air pollution, and infections caused by viruses or bacteria. In any case, your throat becomes red and sore. Your goal for self-care is to reduce  your discomfort while giving your throat a chance to heal.   Moisten and soothe your throat    Tips include the following:   Try a sip of water first thing after waking up.   Keep your throat moist by drinking6 or more glasses of clear liquids every day.   Run a cool-air humidifier in your room overnight.   Stay away from cigarette smoke.   Check the air quality index,if air pollution gives you a sore throat. On high pollution days, try to limit outdoor time.   Suck on throat lozenges, cough drops, hard candy, ice chips, or frozen fruit-juice bars. Use the sugar-free versions if your diet or medical condition requires them.  Gargle to ease irritation  Gargling every hour or2 can ease irritation. Try gargling with1 of these solutions:    1/4teaspoon of salt in1/2 cup of warm water   An over-the-counter anesthetic gargle  Use medicine for more relief  Over-the-counter medicine can reduce sore throat symptoms. Ask your pharmacist if you have questions about which medicine to use. To prevent possible medicine interactions, let the pharmacist know what medicines you take. To decrease symptoms:    Ease pain with anesthetic sprays. Aspirin or an aspirin substitute also helps. Remember, never give aspirin to anyone 40 or younger. Don't take aspirin if you are alreadytaking blood thinners.   For sore throats caused by allergies, try antihistamines to block the allergic reaction.  Unless a sore throat is caused by a bacterial infection, antibiotics won't help you.   Prevent future sore throats  Prevention tips include:   Stop smoking or reduce contact with secondhand smoke. Smoke irritates the tender throat lining.   Limit contact with pets and with allergy-causing substances, such as pollen and mold.   Wash your hands often when you're around someone with a sore throat or cold. This will keep viruses or bacteria from spreading.   Limit outdoor time when air pollution is bad.   Don't strain your vocal  cords.  When to call your healthcare provider  Contact your healthcare provider if you have:    Fever of 100.47F (38.0C) or higher, or as directed by your healthcare provider   White spots on the throat   Great Trouble swallowing   A skin rash   Recent exposure to someone else with strep bacteria   Severe hoarseness and swollen glands in the neck or jaw  Call 911  Call 911 if any of the following occur:    Trouble breathing or catching your breath   Drooling and problems swallowing   Wheezing   Unable to talk   Feeling dizzy or faint   Feeling of doom    StayWell last reviewed this educational content on 02/09/2018   2000-2021 [employer], [place]. All rights reserved. This information is not intended as a substitute for professional medical  care. Always follow your healthcare professional's instructions.

## 2021-01-15 NOTE — Progress Notes (Signed)
[facility]  Urgent Care      Name: Crystal Ruiz  Age and Gender: 20 y.o. female  Date of Birth: 12-Jan-2001  Date of Service: 01/15/2021   MRN: [id number]  PCP: Randa Ngo, MD    Chief Complaint   Patient presents with   . Sore Throat   . Ear Pain     20 year old female presenting with a sore throat and ear pain. Pt states for a day she has had a sore throat that  feels like she is swallowing razor blades, bilateral ear pain and is achy all over.        HPI:    Crystal Ruiz is a 20 y.o. female presenting with sore throat.    Patient with a history of asthma only reports awakening yesterday morning with sensation of her throat feeling as though she is swallowing razor blades.  This sensation continues.  She does admit to a cough occasionally productive for sputum.  She denies fever, chills, headache, earache, abdominal pain, shortness of breath, nausea, vomiting, diarrhea.  She does admit to decreased energy level and generalized body aches.    ROS:  Review of systems per HPI except as noted.  Constitutional:  Per HPI  HEENT: no vision problem, earaches, congestion, or sore throat  Neck: negative pain or limitations  Respiratory:  Per HPI  Cardiovascular: no chest pain, palpitations or edema  Gastrointestinal: no abdominal pain, nausea, vomiting or diarrhea  Musculoskeletal: no swelling.  Myalgias per HPI  Skin:  No acute rashes or lesions  Neurological: No acute headache.  Psych: no anxiety or depression    Below pertinent information reviewed with patient and/or EMR:  No past medical history on file.    Outpatient Medications Marked as Taking for the 01/15/21 encounter (Office Visit) with Provider, [facility]   Medication Sig   . cetirizine (ZYRTEC) 10 mg Oral Tablet Take 10 mg by mouth Once a day   . montelukast (SINGULAIR) 10 mg Oral Tablet Take 10 mg by mouth Every evening        Allergies   Allergen Reactions   . Morphine Rash   . No Known Drug Allergies         No past surgical history on file.     Family Medical History:    None       Social History     Tobacco Use   . Smoking status: Never Smoker   . Smokeless tobacco: Never Used   Substance Use Topics   . Alcohol use: Not Currently   . Drug use: Yes     Types: Marijuana       Objective:  BP 122/75   Pulse 89   Temp 36.7 C (98.1 F)   Resp 18   Ht 1.626 m (5\' 4" )   Wt 61.2 kg (135 lb)   SpO2 98%   BMI 23.17 kg/m        Nursing notes and vital signs reviewed.    Constitutional:  No acute distress.  Alert and oriented to person, place, and time.  Well nourished and well developed appearing.  HENT:   Head: Normocephalic and atraumatic.   Mouth/Throat: Oropharynx is clear, moist membranes, without visible lesions.  Pharynx is moderately erythematous without significant swelling.  Eyes: PERRL, conjunctiva clear   Ears:  TMs and canals appear normal.  Hearing grossly intact.  Neck: Trachea midline. Neck supple.  Positive for small enlarged  anterior nodes. Thyroid normal to palpation  Cardiovascular: RRR, No murmurs, rubs or gallops. Intact distal pulses.  Pulmonary/Chest: BS equal bilaterally. No respiratory distress. No wheezes, rales or rhonchi.  No tachypnea, retractions or accessory muscle use.  Abdominal: BS + and normal.    Musculoskeletal: No edema.    Skin: warm and dry. No rash noted  Psychiatric:  Affect is normal.  Mood is normal.  Neurological:  Patient is awake and alert and oriented x3.  Gait is normal.    Labs:     Rapid strep test is positive.  COVID 19 and respiratory panel are pending      MDM/Course:  Patient remained awake alert and relatively comfortable appearing throughout stay in our department.  A while her strep test (rapid) he is positive, this does not rule out COVID-19 or other respiratory virus.  While patient recently graduated with an undergraduate degree in sociology, at the present time she is working at a [employer], occupational.  Therefore exposure to COVID-19 is distinctly possible.   Patient is to self isolate until COVID test is negative.  She is also to remain off work until she has been on the antibiotic for strep for 24 hours.  She is to use Tylenol and/or ibuprofen for pain.  Patient verbalizes understanding of plan.      Clinical Impression:     Encounter Diagnosis   Name Primary?   . Pharyngitis, unspecified etiology Yes       This note was partially generated using MModal Fluency Direct system, and there may be some incorrect words, spellings, and punctuation that were not noted in checking the note before saving.      Carolee Rota, MD

## 2021-01-17 ENCOUNTER — Encounter (INDEPENDENT_AMBULATORY_CARE_PROVIDER_SITE_OTHER): Payer: Self-pay

## 2021-01-17 ENCOUNTER — Emergency Department
Admission: EM | Admit: 2021-01-17 | Discharge: 2021-01-18 | Disposition: A | Discharge status: Home / Self Care | Payer: Medicaid Other | Attending: Emergency Medicine | Admitting: Emergency Medicine

## 2021-01-17 ENCOUNTER — Ambulatory Visit (INDEPENDENT_AMBULATORY_CARE_PROVIDER_SITE_OTHER): Payer: Medicaid Other

## 2021-01-17 ENCOUNTER — Other Ambulatory Visit: Payer: Self-pay

## 2021-01-17 ENCOUNTER — Emergency Department (HOSPITAL_COMMUNITY): Payer: Medicaid Other

## 2021-01-17 VITALS — BP 120/76 | HR 58 | Temp 98.8°F | Resp 18 | Ht 64.0 in | Wt 135.0 lb

## 2021-01-17 DIAGNOSIS — Z6823 Body mass index (BMI) 23.0-23.9, adult: Secondary | ICD-10-CM

## 2021-01-17 DIAGNOSIS — U071 COVID-19: Secondary | ICD-10-CM

## 2021-01-17 DIAGNOSIS — R519 Headache, unspecified: Secondary | ICD-10-CM | POA: Insufficient documentation

## 2021-01-17 DIAGNOSIS — H53149 Visual discomfort, unspecified: Secondary | ICD-10-CM

## 2021-01-17 DIAGNOSIS — Z8709 Personal history of other diseases of the respiratory system: Secondary | ICD-10-CM

## 2021-01-17 MED ORDER — DIPHENHYDRAMINE 50 MG/ML INJECTION SOLUTION
12.5000 mg | INTRAMUSCULAR | Status: AC
Start: 2021-01-17 — End: 2021-01-17
  Administered 2021-01-17: 12.5 mg via INTRAVENOUS

## 2021-01-17 MED ORDER — KETOROLAC 30 MG/ML (1 ML) INJECTION SOLUTION
30.0000 mg | INTRAMUSCULAR | Status: AC
Start: 2021-01-17 — End: 2021-01-17
  Administered 2021-01-17: 30 mg via INTRAVENOUS

## 2021-01-17 MED ORDER — SODIUM CHLORIDE 0.9 % IV BOLUS
1000.0000 mL | INJECTION | Status: AC
Start: 2021-01-17 — End: 2021-01-17
  Administered 2021-01-17: 0 mL via INTRAVENOUS
  Administered 2021-01-17: 1000 mL via INTRAVENOUS

## 2021-01-17 MED ORDER — KETOROLAC 30 MG/ML (1 ML) INJECTION SOLUTION
INTRAMUSCULAR | Status: AC
Start: 2021-01-17 — End: 2021-01-17
  Filled 2021-01-17: qty 1

## 2021-01-17 MED ORDER — DIPHENHYDRAMINE 50 MG/ML INJECTION SOLUTION
INTRAMUSCULAR | Status: AC
Start: 2021-01-17 — End: 2021-01-17
  Filled 2021-01-17: qty 1

## 2021-01-17 MED ORDER — METOCLOPRAMIDE 5 MG/ML INJECTION SOLUTION
INTRAMUSCULAR | Status: AC
Start: 2021-01-17 — End: 2021-01-17
  Filled 2021-01-17: qty 2

## 2021-01-17 MED ORDER — METOCLOPRAMIDE 5 MG/ML INJECTION SOLUTION
10.0000 mg | INTRAMUSCULAR | Status: AC
Start: 2021-01-17 — End: 2021-01-17
  Administered 2021-01-17 (×2): 10 mg via INTRAVENOUS

## 2021-01-17 NOTE — Patient Instructions (Addendum)
Report to [facility] emergency department now for further evaluation treatment. I spoke to charge nurse, Franklyn Lor.

## 2021-01-17 NOTE — ED Triage Notes (Signed)
Pt is a 20 yr old female presenting to ER from urgent care for evaluation of a headache. Reports headache x 1 1/2 days. Seen on Monday and tested positive for covid and strep. Today seen for headache that is no better.

## 2021-01-17 NOTE — ED Provider Notes (Signed)
[facility]  Emergency Department      Name: Crystal Ruiz  Age and Gender: 20 y.o. female  Date of Birth: 2000/08/17  Date of Service: 01/17/2021   MRN: [id number]  PCP: Randa Ngo, MD    Chief Complaint   Patient presents with   . Headache     "worse headache" she has ever had.        HPI:  Crystal Ruiz is a 20 y.o. female presenting with c/o headache.  She states she has had a terrible headache for a day and half now.  She was diagnosed with strep throat and COVID 2 days ago.  She has been taking cefdinir for strep.  Denies any new fevers.  Denies weakness or numbness.  Denies visual changes. Denies chest pain or sob.  She was seen at Urgent Care today and sent here for CT scan.        ROS:  Review of Systems   Constitutional: Negative.    HENT: Positive for congestion and sore throat.    Eyes: Negative.    Respiratory: Positive for cough. Negative for shortness of breath.    Cardiovascular: Negative.    Gastrointestinal: Negative.    Genitourinary: Negative.    Musculoskeletal: Negative.    Skin: Negative.    Neurological: Positive for headaches.   Endo/Heme/Allergies: Negative.    Psychiatric/Behavioral: Negative.           Objective:  ED Triage Vitals [01/17/21 1944]   BP (Non-Invasive) 122/83   Heart Rate 82   Respiratory Rate 18   Temperature 36.7 C (98.1 F)   SpO2 99 %   Weight 61.2 kg (135 lb)   Height 1.626 m (5\' 4" )       Physical Exam  Vitals and nursing note reviewed.   Constitutional:       General: She is not in acute distress.     Appearance: Normal appearance. She is not ill-appearing.   HENT:      Head: Normocephalic.      Right Ear: Tympanic membrane, ear canal and external ear normal.      Left Ear: Tympanic membrane, ear canal and external ear normal.      Nose: Nose normal.      Mouth/Throat:      Mouth: Mucous membranes are moist.      Pharynx: Oropharynx is clear. Uvula midline. Posterior oropharyngeal erythema present. No uvula swelling.   Eyes:       General:         Right eye: No discharge.         Left eye: No discharge.      Extraocular Movements: Extraocular movements intact.      Conjunctiva/sclera: Conjunctivae normal.      Pupils: Pupils are equal, round, and reactive to light.   Cardiovascular:      Rate and Rhythm: Normal rate and regular rhythm.   Pulmonary:      Effort: Pulmonary effort is normal. No respiratory distress.      Breath sounds: Normal breath sounds.   Abdominal:      Palpations: Abdomen is soft.      Tenderness: There is no abdominal tenderness.   Musculoskeletal:         General: Normal range of motion.      Cervical back: Normal range of motion and neck supple. No rigidity.   Lymphadenopathy:      Cervical: No cervical adenopathy.   Skin:  General: Skin is warm and dry.      Findings: No rash.   Neurological:      General: No focal deficit present.      Mental Status: She is alert and oriented to person, place, and time.      Gait: Gait normal.   Psychiatric:         Mood and Affect: Mood normal.         Behavior: Behavior normal.              Below pertinent information reviewed with patient and/or EMR:  Past Medical History:   Diagnosis Date   . COVID 01/15/2021      Cannot display prior to admission medications because the patient has not been admitted in this contact.        Allergies   Allergen Reactions   . Morphine Rash   . No Known Drug Allergies      No past surgical history on file.  Family Medical History:    None       Social History     Tobacco Use   . Smoking status: Never Smoker   . Smokeless tobacco: Never Used   Substance Use Topics   . Alcohol use: Not Currently   . Drug use: Yes     Types: Marijuana       Diagnostic results:   Imaging:  CT BRAIN WO IV CONTRAST   Final Result by Edi, Radresults In (08/09 2044)   NO ACUTE INTRACRANIAL HEMORRHAGE         One or more dose reduction techniques were used (e.g., Automated exposure control, adjustment of the mA and/or kV according to patient size, use of iterative  reconstruction technique).         Radiologist location ID: [id number]               MDM/Course:   CT negative.  IV fluids, IV Toradol, IV reglan and benadryl.  Pt reports feeling significantly better.   Following the history, physical exam, and ED workup, the patient was deemed stable and suitable for discharge. The patient/caregiver was advised to return to the ED for any new or worsening symptoms.follow-up instructions were discussed with the patient/caregiver in detail, who verbalizes understanding. The patient/caregiver is in agreement and is comfortable with the plan of care.        Clinical Impression:     Encounter Diagnoses   Name Primary?   . COVID-19 Yes   . Headache        Disposition: Discharged       Current Discharge Medication List      CONTINUE these medications - NO CHANGES were made during your visit.      Details   cefdinir 300 mg Capsule  Commonly known as: OMNICEF   300 mg, Oral, 2 TIMES DAILY  Qty: 20 Capsule  Refills: 0     cetirizine 10 mg Tablet  Commonly known as: ZYRTEC   10 mg, Oral, DAILY  Refills: 0     montelukast 10 mg Tablet  Commonly known as: SINGULAIR   10 mg, Oral, EVERY EVENING  Refills: 0            Follow up:    Randa Ngo, MD  [address]  [address]  [place] [address]  [phone]                      ED Provider signature:  Bennie Pierini APRN, [place]  Emergency Department      Parts of this patients chart were completed in a retrospective fashion due to simultaneous direct patient care activities in the Emergency Department.   This note was partially generated using MModal Fluency Direct system, and there may be some incorrect words, spellings, and punctuation that were not noted in checking the note before saving.

## 2021-01-17 NOTE — Progress Notes (Signed)
[facility]  Urgent Care      Name: Crystal Ruiz  Age and Gender: 20 y.o. female  Date of Birth: 12-17-2000  Date of Service: 01/17/2021   MRN: [id number]  PCP: Randa Ngo, MD    Chief Complaint   Patient presents with   . Headache     20 year old female presenting with headache. PT states that she has a stabbing headache that won't go away with OTC meds. PT is COVID positive. PT states she has nausea and is sensitive to light.       HPI:    Crystal Ruiz is a 20 y.o. female presenting with severe headache.    Patient with a history of occasional migraines and who was diagnosed 2 days ago with COVID-19 as well as strep pharyngitis reports awakening yesterday morning with severe headache which has persisted to the present time.  She admits to photophobia with the headache.  She reports the headache is significantly different from any migraine she has had and is lasting longer and is more severe.  She admits to occasional blurred vision with the headache, nausea, vomiting without hematemesis.  She is undergoing antibiotic treatment for this strep.  Patient is on birth control pills but denies cigarette smoking.  Patient denies fever, chills, numbness or weakness of extremities, changes in speech, changes in mentation.  She denies recent falls or trauma to her head.  Patient reports that is worse the headache has been a 10/10 and is currently a 9/10.  She would place her worst migraine in the past at an 8/10.    ROS:  Review of systems per HPI except as noted.  Constitutional: no fever, chills   HEENT:  Per HPI.  Patient reports sore throat is improved with treatment.  Neck: negative pain or limitations  Respiratory: no cough  Gastrointestinal:  Per HPI  Skin:  No acute rashes or lesions  Neurological:  Per HPI  Psych: no anxiety or depression    Below pertinent information reviewed with patient and/or EMR:  Past Medical History:   Diagnosis Date   . COVID 01/15/2021        Outpatient Medications Marked as Taking for the 01/17/21 encounter (Office Visit) with Provider, [facility]   Medication Sig   . cefdinir (OMNICEF) 300 mg Oral Capsule Take 1 Capsule (300 mg total) by mouth Twice daily for 10 days   . cetirizine (ZYRTEC) 10 mg Oral Tablet Take 10 mg by mouth Once a day   . montelukast (SINGULAIR) 10 mg Oral Tablet Take 10 mg by mouth Every evening        Allergies   Allergen Reactions   . Morphine Rash   . No Known Drug Allergies        No past surgical history on file.     Family Medical History:    None       Social History     Tobacco Use   . Smoking status: Never Smoker   . Smokeless tobacco: Never Used   Substance Use Topics   . Alcohol use: Not Currently   . Drug use: Yes     Types: Marijuana       Objective:  BP 120/76   Pulse 58   Temp 37.1 C (98.8 F)   Resp 18   Ht 1.626 m (5\' 4" )   Wt 61.2 kg (135 lb)   SpO2 100%   BMI 23.17 kg/m  Nursing notes and vital signs reviewed.    Constitutional:  awake, alert, and oriented to person, place, and time.  Well nourished and well developed appearing.  Uncomfortable appearing and sitting in dark upon my arrival in room  HENT:   Head: Normocephalic and atraumatic.   Mouth/Throat: Oropharynx is clear, moist membranes, without visible lesions.   Eyes: PERRL, conjunctiva clear.  EOM are intact.    Ears:  Hearing grossly intact.  Neck: Trachea midline. Neck supple. Negative for enlarged nodes or masses. Thyroid normal to palpation  Cardiovascular: RRR, No murmurs, rubs or gallops. Intact distal pulses.  Pulmonary/Chest: BS equal bilaterally. No respiratory distress. No wheezes, rales or rhonchi.  No tachypnea, retractions or accessory muscle use.  Abdominal: BS + and normal.  Soft and nontender without guarding or rebound and without hepatosplenomegaly or masses palpated.  Musculoskeletal: No edema.    Skin: warm and dry. No rash, erythema  Psychiatric:  Affect is normal.  Mood is normal.  Neurological:   Patient is awake and alert and oriented x3.  Cranial nerves 2-12 intact as tested.  Strength is normal and equal bilaterally upper and lower extremities in all muscle groups tested.  Sensation to touch distally is normal and equal bilaterally upper and lower extremities.  DTRs (patellar and biceps)-0 to 1+ and equal bilaterally.        MDM/Course:  Patient remained awake alert with normal speech throughout stay in our department.  She does report having the worst headache of her life.  While this may be a migraine worse than she has ever had before, perhaps due to her current viral and bacterial illnesses, she also has the risk factors for clotting of being on birth control pills as well as having COVID-19.  Therefore I feel that she requires a level of evaluation and care a not available at our facility.  I contacted Helmut Muster, charge nurse at the emergency department [facility], at 7:15 p.m. to notify her of the patient's impending arrival.  Patient's mother is here to drive her to the emergency department.  Patient verbalizes understanding of plan.      Clinical Impression:     Encounter Diagnoses   Name Primary?   . Worst headache of life Yes   . COVID-19    . Photophobia    . History of strep pharyngitis        This note was partially generated using MModal Fluency Direct system, and there may be some incorrect words, spellings, and punctuation that were not noted in checking the note before saving.      Carolee Rota, MD

## 2021-01-24 ENCOUNTER — Other Ambulatory Visit: Payer: Self-pay

## 2021-01-24 ENCOUNTER — Ambulatory Visit: Payer: Medicaid Other | Attending: Family Medicine | Admitting: Family Medicine

## 2021-01-24 ENCOUNTER — Encounter (INDEPENDENT_AMBULATORY_CARE_PROVIDER_SITE_OTHER): Payer: Self-pay | Admitting: Family Medicine

## 2021-01-24 VITALS — BP 128/70 | HR 72

## 2021-01-24 DIAGNOSIS — Z0000 Encounter for general adult medical examination without abnormal findings: Secondary | ICD-10-CM | POA: Insufficient documentation

## 2021-01-24 DIAGNOSIS — G43909 Migraine, unspecified, not intractable, without status migrainosus: Secondary | ICD-10-CM | POA: Insufficient documentation

## 2021-01-24 DIAGNOSIS — J309 Allergic rhinitis, unspecified: Secondary | ICD-10-CM | POA: Insufficient documentation

## 2021-01-24 DIAGNOSIS — J45909 Unspecified asthma, uncomplicated: Secondary | ICD-10-CM | POA: Insufficient documentation

## 2021-01-24 MED ORDER — NORGESTIMATE 0.25 MG-ETHINYL ESTRADIOL 0.035 MG TABLET
1.0000 | ORAL_TABLET | Freq: Every day | ORAL | 3 refills | Status: AC
Start: 2021-01-24 — End: 2022-05-25

## 2021-01-24 MED ORDER — RIZATRIPTAN 10 MG DISINTEGRATING TABLET
10.0000 mg | ORAL_TABLET | Freq: Once | ORAL | 3 refills | Status: AC | PRN
Start: 2021-01-24 — End: 2021-02-02

## 2021-01-24 NOTE — Progress Notes (Signed)
FAMILY MEDICINE, [facility]  [address]  [place] [address]  Operated by [facility]    Annual exam      NAME: Crystal Ruiz DOS: 01/24/2021   MRN: [id number] DOB: July 07, 2000     Chief Complaint: Annual Exam      History of Present Illness: Crystal Ruiz is a 20 y.o. female who comes in today for an annual wellness exam.    Reports good compliance with current medication regimen.  Nursing Notes:   Ester Rink, LPN  80/88/11 0315  Signed  Pt reports the following today:  Denies suicidal, homicidal ideations    Reviewed the following ROS:    Constitutional (fever, night sweats, weight gain, weight loss, exercise intolerance)    Eyes (dry eyes, irritation, vision change)    ENMT (difficulty hearing, ear pain, frequent nosebleeds, nose/sinus problems, frequent nosebleeds, sore throat, bleeding gums, snoring, dry mouth, mouth ulcers, oral abnormalities, teeth problems)    Cardiovascular (cp, palpitations, heart murmur)    Respiratory (coughing, wheezing, sob)    Gastrointestinal (abdominal pain, change in appetite, diarrhea/constipation)    Genitourinary (urination problems)     Musculoskeletal (arthralgias/joint pain, back pain, swelling hands or feet)    Integumentary (abnormal mole, rash, growths/lesions)    Neurologic (loss of consciousness, generalized weakness, numbness, seizures, dizziness, headaches/migraines)    Psychiatric (depression, anxiety, sleep disturbances)    Endocrine (fatigue)    Hematologic/Lymphatic (swollen glands, easy bruising)    Allergic/Immunologic (runny nose, sinus pressure, itching, hives, frequent sneezing)         Medical History     I have reviewed and updated as appropriate the past medical, surgical, family, and social history today:  Medical History/Surgical History/Family History/Social History  Past Medical History:   Diagnosis Date   . COVID 01/15/2021         No past surgical history on file.  Family Medical History:    None           Social History     Socioeconomic History   . Marital status: Single   Tobacco Use   . Smoking status: Never Smoker   . Smokeless tobacco: Never Used   Vaping Use   . Vaping Use: Never used   Substance and Sexual Activity   . Alcohol use: Not Currently   . Drug use: Yes     Types: Marijuana       Allergies:  Allergies   Allergen Reactions   . Morphine Rash       Medications:  Current Outpatient Medications   Medication Sig   . azelastine (ASTELIN) 137 mcg (0.1 %) Nasal Aerosol, Spray Administer 2 Sprays into each nostril Twice daily   . cefdinir (OMNICEF) 300 mg Oral Capsule Take 1 Capsule (300 mg total) by mouth Twice daily for 10 days   . cetirizine (ZYRTEC) 10 mg Oral Tablet Take 10 mg by mouth Once a day   . EPINEPHrine 0.3 mg/0.3 mL Injection Auto-Injector Inject 0.3 mg into the muscle Once, as needed   . fluticasone propionate (FLONASE) 50 mcg/actuation Nasal Spray, Suspension Administer 2 Sprays into each nostril Twice per day as needed   . montelukast (SINGULAIR) 10 mg Oral Tablet Take 10 mg by mouth Every evening   . Norgestimate-Ethinyl Estradiol (equiv to: ORTHO-CYCLEN) 0.25-35 mg-mcg Oral Tablet 1 Tablet Once a day   . PROAIR HFA 90 mcg/actuation Inhalation HFA Aerosol Inhaler 2 Puffs Every 4 hours as needed  Objective     Physical Exam:  General: In no acute distress, well developed,well nourished  Head: Normocephalic and atraumatic.  Neck: supple,No thyromegaly, trachea mid-line.bruits,JVD  Eyes: Pupils equal, round. . No nystagmus. Conjunctiva clear.  Ears:Canals clear TM's intact  Throat:Clear,no erythema  Respiratory: Regular rate. CTA in all fields.    Cardiovascular: RRR. No swelling/edema of exposed extremities.  Abdomen:soft ,non-tender,non-distended  Back:No CVAT  Musculoskeletal: Gait/station unremarkable. Moving all 4 extremities. No observed joint swelling.   Neuro: Alert, oriented to person, place, time, situation. No abnormal movements noted. No tremor.No focal motor  deficits  Skin: Dry. No diaphoresis or flushing. No noticeable erythema, abrasions, or lesions on exposed skin.Normal turgor,no rashes  Psych:normal mood & affect      Assessment/Plan   Diagnosis:    ICD-10-CM    1. Annual physical exam  Z00.00    2. Migraine  G43.909    3. Allergic rhinitis  J30.9    4. Asthma  J45.909        Plan: Recheck in 1 yr      The patient was given ample opportunity to ask questions and those questions were answered to their satisfaction. A good faith effort was made to reconcile the patient's medications. Mechanism of underlying disease process and action of medications discussed with the patient. Encouraged patient to increase dietary efforts, exercise. We reviewed a healthy diet and regular exercise and their impact on both physical and mental health. Counseled on any appropriate vaccinations by the provider and questions were answered.  Patient encouraged to contact me with any additional questions or concerns, or go to the ED in an emergency.      Randa Ngo, MD  01/24/2021 14:39     This note was partially created using M*Modal fluency direct system (voice recognition software ) and is inherently subject to errors including those of syntax and "sound- alike" substitutions which may escape proofreading.  In such instances, original meaning may be extrapolated by contextual derivation.

## 2021-01-24 NOTE — Nursing Note (Signed)
Pt reports the following today:  Denies suicidal, homicidal ideations    Reviewed the following ROS:    Constitutional (fever, night sweats, weight gain, weight loss, exercise intolerance)    Eyes (dry eyes, irritation, vision change)    ENMT (difficulty hearing, ear pain, frequent nosebleeds, nose/sinus problems, frequent nosebleeds, sore throat, bleeding gums, snoring, dry mouth, mouth ulcers, oral abnormalities, teeth problems)    Cardiovascular (cp, palpitations, heart murmur)    Respiratory (coughing, wheezing, sob)    Gastrointestinal (abdominal pain, change in appetite, diarrhea/constipation)    Genitourinary (urination problems)     Musculoskeletal (arthralgias/joint pain, back pain, swelling hands or feet)    Integumentary (abnormal mole, rash, growths/lesions)    Neurologic (loss of consciousness, generalized weakness, numbness, seizures, dizziness, headaches/migraines)    Psychiatric (depression, anxiety, sleep disturbances)    Endocrine (fatigue)    Hematologic/Lymphatic (swollen glands, easy bruising)    Allergic/Immunologic (runny nose, sinus pressure, itching, hives, frequent sneezing)

## 2021-05-25 ENCOUNTER — Other Ambulatory Visit (INDEPENDENT_AMBULATORY_CARE_PROVIDER_SITE_OTHER): Payer: Self-pay | Admitting: Family Medicine

## 2021-05-25 DIAGNOSIS — Z309 Encounter for contraceptive management, unspecified: Secondary | ICD-10-CM

## 2021-05-25 MED ORDER — NORGESTIMATE 0.25 MG-ETHINYL ESTRADIOL 0.035 MG TABLET
1.0000 | ORAL_TABLET | Freq: Every day | ORAL | 3 refills | Status: AC
Start: 2021-05-25 — End: ?

## 2021-06-06 ENCOUNTER — Ambulatory Visit (INDEPENDENT_AMBULATORY_CARE_PROVIDER_SITE_OTHER): Payer: Self-pay | Admitting: Physician Assistant

## 2021-10-23 ENCOUNTER — Other Ambulatory Visit (HOSPITAL_BASED_OUTPATIENT_CLINIC_OR_DEPARTMENT_OTHER): Payer: Self-pay

## 2021-10-23 ENCOUNTER — Encounter (HOSPITAL_BASED_OUTPATIENT_CLINIC_OR_DEPARTMENT_OTHER): Payer: Self-pay

## 2021-10-23 ENCOUNTER — Emergency Department (HOSPITAL_BASED_OUTPATIENT_CLINIC_OR_DEPARTMENT_OTHER)
Admission: EM | Admit: 2021-10-23 | Discharge: 2021-10-23 | Disposition: A | Discharge status: Home / Self Care | Payer: MEDICARE | Attending: Emergency Medicine | Admitting: Emergency Medicine

## 2021-10-23 ENCOUNTER — Other Ambulatory Visit: Payer: Self-pay

## 2021-10-23 DIAGNOSIS — R197 Diarrhea, unspecified: Secondary | ICD-10-CM | POA: Insufficient documentation

## 2021-10-23 DIAGNOSIS — R112 Nausea with vomiting, unspecified: Secondary | ICD-10-CM | POA: Insufficient documentation

## 2021-10-23 DIAGNOSIS — R1012 Left upper quadrant pain: Secondary | ICD-10-CM | POA: Insufficient documentation

## 2021-10-23 LAB — COMPREHENSIVE METABOLIC PANEL
ALT: 18 U/L (ref 0–44)
AST: 15 U/L (ref 15–41)
Albumin: 4.4 g/dL (ref 3.5–5.0)
Alkaline Phosphatase: 44 U/L (ref 38–126)
Anion gap: 9 (ref 5–15)
BUN: 15 mg/dL (ref 6–20)
CO2: 29 mmol/L (ref 22–32)
Calcium: 9.4 mg/dL (ref 8.9–10.3)
Chloride: 100 mmol/L (ref 98–111)
Creatinine, Ser: 0.66 mg/dL (ref 0.44–1.00)
GFR, Estimated: >60 mL/min (ref >60)
Glucose, Bld: 93 mg/dL (ref 70–99)
Potassium: 3.9 mmol/L (ref 3.5–5.1)
Sodium: 138 mmol/L (ref 135–145)
Total Bilirubin: 1.2 mg/dL (ref 0.3–1.2)
Total Protein: 7.8 g/dL (ref 6.5–8.1)

## 2021-10-23 LAB — URINALYSIS, ROUTINE W REFLEX MICROSCOPIC
Bilirubin Urine: NEGATIVE
Glucose, UA: NEGATIVE mg/dL
Ketones, ur: NEGATIVE mg/dL
Leukocytes,Ua: NEGATIVE
Nitrite: NEGATIVE
Specific Gravity, Urine: 1.032 — ABNORMAL HIGH (ref 1.005–1.030)
pH: 6.5 (ref 5.0–8.0)

## 2021-10-23 LAB — CBC
HCT: 39.6 % (ref 36.0–46.0)
Hemoglobin: 13 g/dL (ref 12.0–15.0)
MCH: 29 pg (ref 26.0–34.0)
MCHC: 32.8 g/dL (ref 30.0–36.0)
MCV: 88.2 fL (ref 80.0–100.0)
Platelets: 360 10*3/uL (ref 150–400)
RBC: 4.49 MIL/uL (ref 3.87–5.11)
RDW: 12.4 % (ref 11.5–15.5)
WBC: 7.5 10*3/uL (ref 4.0–10.5)
nRBC: 0 % (ref 0.0–0.2)

## 2021-10-23 LAB — LIPASE, BLOOD: Lipase: 10 U/L — ABNORMAL LOW (ref 11–51)

## 2021-10-23 LAB — PREGNANCY, URINE: Preg Test, Ur: NEGATIVE

## 2021-10-23 MED ORDER — SODIUM CHLORIDE 0.9 % IV BOLUS
1000.0000 mL | Freq: Once | INTRAVENOUS | Status: AC
  Administered 2021-10-23: 1000 mL via INTRAVENOUS

## 2021-10-23 MED ORDER — ONDANSETRON 4 MG PO TBDP
4.0000 mg | ORAL_TABLET | Freq: Three times a day (TID) | ORAL | 0 refills | Status: AC | PRN
  Filled 2021-10-23: qty 20, 7d supply, fill #0

## 2021-10-23 NOTE — ED Notes (Signed)
Patient verbalizes understanding of discharge instructions. Opportunity for questioning and answers were provided. Armband removed by staff, pt discharged from ED to home via POV  

## 2021-10-23 NOTE — Discharge Instructions (Addendum)
Please follow-up with [facility] to establish care and for follow-up.  Please return to the emergency department for any worsening symptoms. ?

## 2021-10-23 NOTE — ED Triage Notes (Signed)
Patient BIB [facility] from Work. ? ?Patient states she was at Work when she had an Episode of Nausea and 3 Episodes of Emesis. Associated with Diarrhea and LLQ ABD Pain. ? ?No Fevers. VSS with [facility] en Route. ? ?NAD Noted during Triage. A&Ox4. GCS 15. Ambulatory.  ?

## 2021-10-23 NOTE — ED Provider Notes (Signed)
?[facility] EMERGENCY DEPT ?Provider Note ? ? ?CSN: [id number] ?Arrival date & time: 10/23/21  1255 ? ?  ? ?History ?Chief Complaint  ?Patient presents with  ? Emesis  ? ? ?Michelle Byrd is a 21 y.o. female who presents to the emergency department today with left upper abdominal pain, nausea, vomiting, and diarrhea.  Patient states she woke up feeling fine naproxen on 8 AM this morning started feeling nauseous had some subsequent vomiting and 2 episodes of diarrhea.  Pain was initially constant in the left upper quadrant but is since improved and resolved.  She denies any urinary complaints, fever, chills, cough, congestion. ? ? ?Emesis ? ?  ? ?Home Medications ?Prior to Admission medications   ?Medication Sig Start Date End Date Taking? Authorizing Provider  ?ondansetron (ZOFRAN-ODT) 4 MG disintegrating tablet Take 1 tablet (4 mg total) by mouth every 8 (eight) hours as needed for nausea or vomiting. 10/23/21  Yes Honor Loh M, PA-C  ?omeprazole (PRILOSEC) 20 MG capsule Take 1 capsule (20 mg total) by mouth daily. ?Patient not taking: Reported on 08/19/2017 01/23/17   Muthersbaugh, Dahlia Client, PA-C  ?PROAIR HFA 108 (90 Base) MCG/ACT inhaler Take 2 puffs by mouth every 4 (four) hours as needed for wheezing or shortness of breath.  05/20/17   [provider]  ?ranitidine (ZANTAC) 150 MG tablet Take 1 tablet (150 mg total) by mouth 2 (two) times daily. ?Patient not taking: Reported on 04/01/2018 01/23/17   Muthersbaugh, Dahlia Client, PA-C  ?   ? ?Allergies    ?Morphine and related   ? ?Review of Systems   ?Review of Systems  ?Gastrointestinal:  Positive for vomiting.  ?All other systems reviewed and are negative. ? ?Physical Exam ?Updated Vital Signs ?BP 123/62   Pulse 99   Temp 97.8 ?F (36.6 ?C) (Oral)   Resp 16   Ht 5\' 4"  (1.626 m)   Wt 59 kg   SpO2 100%   BMI 22.33 kg/m?  ?Physical Exam ?Vitals and nursing note reviewed.  ?Constitutional:   ?   General: She is not in acute distress. ?    Appearance: Normal appearance.  ?HENT:  ?   Head: Normocephalic and atraumatic.  ?Eyes:  ?   General:     ?   Right eye: No discharge.     ?   Left eye: No discharge.  ?Cardiovascular:  ?   Comments: Regular rate and rhythm.  S1/S2 are distinct without any evidence of murmur, rubs, or gallops.  Radial pulses are 2+ bilaterally.  Dorsalis pedis pulses are 2+ bilaterally.  No evidence of pedal edema. ?Pulmonary:  ?   Comments: Clear to auscultation bilaterally.  Normal effort.  No respiratory distress.  No evidence of wheezes, rales, or rhonchi heard throughout. ?Abdominal:  ?   General: Abdomen is flat. Bowel sounds are normal. There is no distension.  ?   Tenderness: There is no abdominal tenderness. There is no guarding or rebound.  ?Musculoskeletal:     ?   General: Normal range of motion.  ?   Cervical back: Neck supple.  ?Skin: ?   General: Skin is warm and dry.  ?   Findings: No rash.  ?Neurological:  ?   General: No focal deficit present.  ?   Mental Status: She is alert.  ?Psychiatric:     ?   Mood and Affect: Mood normal.     ?   Behavior: Behavior normal.  ? ? ?ED Results / Procedures / Treatments   ?  Labs ?(all labs ordered are listed, but only abnormal results are displayed) ?Labs Reviewed  ?LIPASE, BLOOD - Abnormal; Notable for the following components:  ?    Result Value  ? Lipase 10 (*)   ? All other components within normal limits  ?URINALYSIS, ROUTINE W REFLEX MICROSCOPIC - Abnormal; Notable for the following components:  ? Specific Gravity, Urine 1.032 (*)   ? Hgb urine dipstick TRACE (*)   ? Protein, ur TRACE (*)   ? Bacteria, UA RARE (*)   ? All other components within normal limits  ?COMPREHENSIVE METABOLIC PANEL  ?CBC  ?PREGNANCY, URINE  ? ? ?EKG ?None ? ?Radiology ?No results found. ? ?Procedures ?Procedures  ? ? ?Medications Ordered in ED ?Medications  ?sodium chloride 0.9 % bolus 1,000 mL (0 mLs Intravenous Stopped 10/23/21 1618)  ? ? ?ED Course/ Medical Decision Making/ A&P ?Clinical Course as  of 10/23/21 1643  ?Mon Oct 23, 2021  ?1636 On reevaluation, patient is feeling better after fluids.  I discussed all the results of the labs at the bedside. [CF]  ?1637 Comprehensive metabolic panel ?No acute abnormalities [CF]  ?1637 . [CF]  ?1637 CBC ?No evidence of leukocytosis or anemia. [CF]  ?1637 Lipase, blood(!) ?Negative. [CF]  ?1637 Urinalysis, Routine w reflex microscopic Urine, Clean Catch(!) ?Without signs of infection. [CF]  ?1637 Pregnancy, urine ?Pregnancy negative. [CF]  ?  ?Clinical Course User Index ?[CF] Honor Loh M, PA-C  ? ?                        ?Medical Decision Making ?Michelle Byrd is a 21 y.o. female who presents to the emergency department with left upper abdominal pain, nausea, vomiting, and diarrhea.  Differential diagnosis includes gastroenteritis, pancreatitis, GERD. ? ? ?Amount and/or Complexity of Data Reviewed ?External Data Reviewed: notes. ?   Details: Patient does have a history of esophagitis and has been seen evaluated in the emergency department for similar symptoms in the past.  Work-up was ultimately unrevealing at that time. ?Labs: ordered. Decision-making details documented in ED Course. ? ?Risk ?OTC drugs. ?Prescription drug management. ?Decision regarding hospitalization. ?Risk Details: Patient presents to the emergency department with seemingly viral gastroenteritis symptoms.  Patient did not have any significant tenderness on my exam and I have a low suspicion for acute abdomen at this time.  Given the acute nature of her nausea, vomiting, and diarrhea I am suspicious for viral gastroenteritis.  Patient is otherwise young and healthy.  Vital signs are completely normal.  No evidence of infection on labs.  Ultimately decided to defer imaging for now.  Patient is feeling much better after fluids and uncomfortable sending her home with Zofran prescription.  I will have her follow-up with [facility] [place] [facility] for PCP follow-up and to  establish care.  She was encouraged to return to the emergency department for any worsening symptoms. ? ? ?Final Clinical Impression(s) / ED Diagnoses ?Final diagnoses:  ?Nausea and vomiting, unspecified vomiting type  ?Diarrhea, unspecified type  ? ? ?Rx / DC Orders ?ED Discharge Orders   ? ?      Ordered  ?  ondansetron (ZOFRAN-ODT) 4 MG disintegrating tablet  Every 8 hours PRN       ? 10/23/21 1643  ? ?  ?  ? ?  ? ? ?  ?Honor Loh [place], PA-C ?10/23/21 1643 ? ?  ?Virgina Norfolk, DO ?10/24/21 [id number] ? ?

## 2021-10-23 NOTE — ED Notes (Signed)
Patient BIB [facility] from Work. ? ?[facility] states Patient has had approximately 2-3 Hour of N/V/D. Associated with ABD Pain.  ? ?VSS with [facility].  ?

## 2022-09-25 ENCOUNTER — Encounter (HOSPITAL_BASED_OUTPATIENT_CLINIC_OR_DEPARTMENT_OTHER): Payer: Self-pay

## 2022-09-25 ENCOUNTER — Other Ambulatory Visit: Payer: Self-pay

## 2022-09-25 ENCOUNTER — Emergency Department (HOSPITAL_BASED_OUTPATIENT_CLINIC_OR_DEPARTMENT_OTHER)
Admission: EM | Admit: 2022-09-25 | Discharge: 2022-09-25 | Disposition: A | Discharge status: Home / Self Care | Payer: Commercial Managed Care - PPO | Attending: Emergency Medicine | Admitting: Emergency Medicine

## 2022-09-25 DIAGNOSIS — R1013 Epigastric pain: Secondary | ICD-10-CM | POA: Insufficient documentation

## 2022-09-25 DIAGNOSIS — J45909 Unspecified asthma, uncomplicated: Secondary | ICD-10-CM | POA: Diagnosis not present

## 2022-09-25 DIAGNOSIS — R1010 Upper abdominal pain, unspecified: Secondary | ICD-10-CM

## 2022-09-25 LAB — URINALYSIS, ROUTINE W REFLEX MICROSCOPIC
Bacteria, UA: NONE SEEN
Bilirubin Urine: NEGATIVE
Glucose, UA: NEGATIVE mg/dL
Ketones, ur: NEGATIVE mg/dL
Leukocytes,Ua: NEGATIVE
Nitrite: NEGATIVE
Specific Gravity, Urine: 1.031 — ABNORMAL HIGH (ref 1.005–1.030)
pH: 6 (ref 5.0–8.0)

## 2022-09-25 LAB — CBC
HCT: 37.8 % (ref 36.0–46.0)
Hemoglobin: 12.6 g/dL (ref 12.0–15.0)
MCH: 29.9 pg (ref 26.0–34.0)
MCHC: 33.3 g/dL (ref 30.0–36.0)
MCV: 89.6 fL (ref 80.0–100.0)
Platelets: 337 10*3/uL (ref 150–400)
RBC: 4.22 MIL/uL (ref 3.87–5.11)
RDW: 12.9 % (ref 11.5–15.5)
WBC: 7.2 10*3/uL (ref 4.0–10.5)
nRBC: 0 % (ref 0.0–0.2)

## 2022-09-25 LAB — COMPREHENSIVE METABOLIC PANEL
ALT: 20 U/L (ref 0–44)
AST: 13 U/L — ABNORMAL LOW (ref 15–41)
Albumin: 4.2 g/dL (ref 3.5–5.0)
Alkaline Phosphatase: 48 U/L (ref 38–126)
Anion gap: 9 (ref 5–15)
BUN: 13 mg/dL (ref 6–20)
CO2: 26 mmol/L (ref 22–32)
Calcium: 9.1 mg/dL (ref 8.9–10.3)
Chloride: 104 mmol/L (ref 98–111)
Creatinine, Ser: 0.65 mg/dL (ref 0.44–1.00)
GFR, Estimated: >60 mL/min (ref >60)
Glucose, Bld: 94 mg/dL (ref 70–99)
Potassium: 3.6 mmol/L (ref 3.5–5.1)
Sodium: 139 mmol/L (ref 135–145)
Total Bilirubin: 0.6 mg/dL (ref 0.3–1.2)
Total Protein: 7.1 g/dL (ref 6.5–8.1)

## 2022-09-25 LAB — LIPASE, BLOOD: Lipase: 18 U/L (ref 11–51)

## 2022-09-25 LAB — PREGNANCY, URINE: Preg Test, Ur: NEGATIVE

## 2022-09-25 NOTE — Discharge Instructions (Signed)
Received in the emergency department due to upper abdominal pain.  Your laboratory workup is reassuring as we discussed.  I suspect you may have a gastritis versus duodenal ulcer.  Avoid alcohol, avoid anti-inflammatory medicine.  You take Tylenol as needed for pain, try taking Prilosec 30 minutes before your first meal of the day for the next 4 weeks.  Follow-up with a primary care doctor in the next few weeks, information above to establish care.  If the pain worsens or changes in any way or you have new or concerning symptoms return to the ED for further evaluation and possible imaging.

## 2022-09-25 NOTE — ED Provider Notes (Signed)
[place] EMERGENCY DEPARTMENT AT [facility] Provider Note   CSN: [id number] Arrival date & time: 09/25/22  1842     History  Chief Complaint  Patient presents with   Abdominal Pain    Michelle Byrd is a 22 y.o. female.   Abdominal Pain    Patient is a 66 female with medical history of asthma and reflux esophagitis presenting to the emergency room due to epigastric pain.  Started 3 days ago, sexually improved with eating meals shortly and then it comes back.  Feeling a cramping pain without radiation elsewhere.  There is no nausea, vomiting, chest pain, diarrhea, blood in stool.  She takes anti-inflammatories infrequently but does drink alcohol slightly frequently.  Never has had a gastric ulcer, history of cholecystectomy but no other abdominal surgeries.  She has not taken any medicine for the symptoms prior to arrival.  Home Medications Prior to Admission medications   Medication Sig Start Date End Date Taking? Authorizing Provider  omeprazole (PRILOSEC) 20 MG capsule Take 1 capsule (20 mg total) by mouth daily. Patient not taking: Reported on 08/19/2017 01/23/17   Muthersbaugh, Dahlia Client, PA-C  ondansetron (ZOFRAN-ODT) 4 MG disintegrating tablet Take 1 tablet (4 mg total) by mouth every 8 (eight) hours as needed for nausea or vomiting. 10/23/21   Honor Loh M, PA-C  PROAIR HFA 108 [phone] Base) MCG/ACT inhaler Take 2 puffs by mouth every 4 (four) hours as needed for wheezing or shortness of breath.  05/20/17   [provider]  ranitidine (ZANTAC) 150 MG tablet Take 1 tablet (150 mg total) by mouth 2 (two) times daily. Patient not taking: Reported on 04/01/2018 01/23/17   Muthersbaugh, Dahlia Client, PA-C      Allergies    Morphine and related    Review of Systems   Review of Systems  Gastrointestinal:  Positive for abdominal pain.    Physical Exam Updated Vital Signs BP 128/79   Pulse 66   Temp 98.1 F (36.7 C) (Oral)   Resp 18   Ht  (1.6 m)   Wt  61.2 kg   LMP 09/17/2022   SpO2 100%   BMI 23.91 kg/m  Physical Exam Vitals and nursing note reviewed. Exam conducted with a chaperone present.  Constitutional:      Appearance: Normal appearance.  HENT:     Head: Normocephalic and atraumatic.  Eyes:     General: No scleral icterus.       Right eye: No discharge.        Left eye: No discharge.     Extraocular Movements: Extraocular movements intact.     Pupils: Pupils are equal, round, and reactive to light.  Cardiovascular:     Rate and Rhythm: Normal rate and regular rhythm.     Pulses: Normal pulses.     Heart sounds: Normal heart sounds. No murmur heard.    No friction rub. No gallop.  Pulmonary:     Effort: Pulmonary effort is normal. No respiratory distress.     Breath sounds: Normal breath sounds.  Abdominal:     General: Abdomen is flat. Bowel sounds are normal. There is no distension.     Palpations: Abdomen is soft.     Tenderness: There is abdominal tenderness in the epigastric area.     Comments: Very mild epigastric tenderness without rigidity or guarding, distractible.  Skin:    General: Skin is warm and dry.     Coloration: Skin is not jaundiced.  Neurological:  Mental Status: She is alert. Mental status is at baseline.     Coordination: Coordination normal.     ED Results / Procedures / Treatments   Labs (all labs ordered are listed, but only abnormal results are displayed) Labs Reviewed  COMPREHENSIVE METABOLIC PANEL - Abnormal; Notable for the following components:      Result Value   AST 13 (*)    All other components within normal limits  URINALYSIS, ROUTINE W REFLEX MICROSCOPIC - Abnormal; Notable for the following components:   Specific Gravity, Urine 1.031 (*)    Hgb urine dipstick TRACE (*)    Protein, ur TRACE (*)    All other components within normal limits  LIPASE, BLOOD  CBC  PREGNANCY, URINE    EKG None  Radiology No results found.  Procedures Procedures    Medications  Ordered in ED Medications - No data to display  ED Course/ Medical Decision Making/ A&P                             Medical Decision Making Amount and/or Complexity of Data Reviewed Labs: ordered.   This is a 55 female status post cholecystectomy presenting to the emergency room due to epigastric pain.  Differential includes pancreatitis, gastritis, PUD, biliary colic, GERD, digestion, electrolyte derangement.  Patient's abdomen is nonperitoneal, she has some very mild epigastric tenderness without rigidity or guarding.  Upper and lower extremity pulses are symmetric, no right upper quadrant tenderness and she is status post cholecystectomy.  She is very well-appearing, not having any rectal bleeding or concerns for GI bleed.   Laboratory workup without leukocytosis, anemia, lipase within normal limits not consistent pancreatitis, no transaminitis, AKI, electrolyte derangement and patient is not pregnant so not a ruptured ectopic.  The fact the pain improves after eating could be indicative of possible duodenal ulcer.  She is not currently on a proton pump inhibitor so we will start that.  Will advise avoiding NSAIDs, alcohol and PPI with GI follow-up in outpatient setting.  I discussed return precautions the patient who verbalized understanding agreement with the plan.        Final Clinical Impression(s) / ED Diagnoses Final diagnoses:  None    Rx / DC Orders ED Discharge Orders     None         Theron Arista, Cordelia Poche 09/25/22 2258    Ernie Avena, MD 09/25/22 2310

## 2022-09-25 NOTE — ED Triage Notes (Signed)
Abdomen pain. Pt reports hx of gallbladder removed. Pt states "I think its an ulcer." Mid abdomen. Pain is worse after eating.

## 2022-09-26 ENCOUNTER — Ambulatory Visit (HOSPITAL_COMMUNITY): Payer: Commercial Managed Care - PPO

## 2023-10-29 ENCOUNTER — Encounter (INDEPENDENT_AMBULATORY_CARE_PROVIDER_SITE_OTHER): Payer: Self-pay
